# Patient Record
Sex: Female | Born: 1975 | Race: White | Hispanic: No | Marital: Married | Smoking: Never smoker
Health system: Southern US, Community
[De-identification: ages and names within clinical notes are randomized; demographics above are authoritative.]

## PROBLEM LIST (undated history)

## (undated) DIAGNOSIS — D039 Melanoma in situ, unspecified: Secondary | ICD-10-CM

## (undated) DIAGNOSIS — A58 Granuloma inguinale: Secondary | ICD-10-CM

## (undated) DIAGNOSIS — H04811 Granuloma of right lacrimal passage: Secondary | ICD-10-CM

## (undated) HISTORY — DX: Melanoma in situ, unspecified: D03.9

## (undated) HISTORY — PX: MELANOMA EXCISION: SHX5266

## (undated) HISTORY — PX: OTHER SURGICAL HISTORY: SHX169

## (undated) HISTORY — DX: Granuloma inguinale: A58

## (undated) HISTORY — DX: Granuloma of right lacrimal passage: H04.811

---

## 2004-09-19 ENCOUNTER — Inpatient Hospital Stay (HOSPITAL_COMMUNITY): Admission: AD | Admit: 2004-09-19 | Discharge: 2004-09-21 | Payer: Self-pay | Admitting: Obstetrics and Gynecology

## 2004-09-22 ENCOUNTER — Encounter: Admission: RE | Admit: 2004-09-22 | Discharge: 2004-10-22 | Payer: Self-pay | Admitting: Obstetrics and Gynecology

## 2019-07-21 ENCOUNTER — Emergency Department (HOSPITAL_COMMUNITY)
Admission: EM | Admit: 2019-07-21 | Discharge: 2019-07-21 | Disposition: A | Discharge status: Home / Self Care | Payer: Self-pay | Attending: Emergency Medicine | Admitting: Emergency Medicine

## 2019-07-21 ENCOUNTER — Other Ambulatory Visit: Payer: Self-pay

## 2019-07-21 ENCOUNTER — Emergency Department (HOSPITAL_COMMUNITY): Payer: Self-pay

## 2019-07-21 DIAGNOSIS — R079 Chest pain, unspecified: Secondary | ICD-10-CM

## 2019-07-21 DIAGNOSIS — R0600 Dyspnea, unspecified: Secondary | ICD-10-CM | POA: Insufficient documentation

## 2019-07-21 DIAGNOSIS — R0789 Other chest pain: Secondary | ICD-10-CM | POA: Insufficient documentation

## 2019-07-21 LAB — CBC
HCT: 43.4 % (ref 36.0–46.0)
Hemoglobin: 13.9 g/dL (ref 12.0–15.0)
MCH: 30.2 pg (ref 26.0–34.0)
MCHC: 32 g/dL (ref 30.0–36.0)
MCV: 94.1 fL (ref 80.0–100.0)
Platelets: 412 10*3/uL — ABNORMAL HIGH (ref 150–400)
RBC: 4.61 MIL/uL (ref 3.87–5.11)
RDW: 13 % (ref 11.5–15.5)
WBC: 13.6 10*3/uL — ABNORMAL HIGH (ref 4.0–10.5)
nRBC: 0 % (ref 0.0–0.2)

## 2019-07-21 LAB — BASIC METABOLIC PANEL
Anion gap: 10 (ref 5–15)
BUN: 13 mg/dL (ref 6–20)
CO2: 24 mmol/L (ref 22–32)
Calcium: 9.8 mg/dL (ref 8.9–10.3)
Chloride: 106 mmol/L (ref 98–111)
Creatinine, Ser: 0.79 mg/dL (ref 0.44–1.00)
GFR calc Af Amer: >60 mL/min (ref >60)
GFR calc non Af Amer: >60 mL/min (ref >60)
Glucose, Bld: 96 mg/dL (ref 70–99)
Potassium: 3.5 mmol/L (ref 3.5–5.1)
Sodium: 140 mmol/L (ref 135–145)

## 2019-07-21 LAB — I-STAT BETA HCG BLOOD, ED (MC, WL, AP ONLY): I-stat hCG, quantitative: <5 m[IU]/mL (ref <5)

## 2019-07-21 LAB — TROPONIN I (HIGH SENSITIVITY): Troponin I (High Sensitivity): 2 ng/L (ref <18)

## 2019-07-21 NOTE — Discharge Instructions (Addendum)
The testing today in the emergency department is reassuring.  This also helps to bolster the findings from [facility] emergency department about a week ago.  Unfortunately, we do not have a reason for your trouble breathing and chest discomfort.  We are referring you to a pulmonary medical practice for further evaluation and treatment of the trouble breathing.  They should be able to help you with the chest discomfort as well.  Sometimes people need additional evaluation by a cardiologist.  Starting with the lungs however makes the most sense at this time.  In the meantime, make sure you are sleeping well, eating and drinking regularly and taking time to care for your needs.  Return here if needed for problems that can be managed at home.

## 2019-07-21 NOTE — ED Provider Notes (Signed)
[facility] EMERGENCY DEPARTMENT Provider Note   CSN: [id number] Arrival date & time: 07/21/19  1259     History Chief Complaint  Patient presents with  . Chest Pain  . Shortness of Breath    Tiffany Hughes is a 44 y.o. female.  HPI She presents for evaluation of shortness of breath which is primarily dyspnea on exertion but occasionally occurs at rest.  She also has chest discomfort described as a burning sensation and tight feeling in the right side of her neck.  The symptoms have been going on for 3 weeks.  She was evaluated a week ago at an emergency department at [facility], with comprehensive testing including advanced imaging.  Findings were reassuring and not diagnostic for her discomfort at the time.  She was referred to PCP but has not seen a PCP yet.  She has appointment with a new internist, scheduled for next week.  She actually has a internist currently, but has decided not to see that doctor again.  She did see this doctor, 3 weeks ago had laboratory testing that did not reveal anything, and the patient was disappointed that she did not receive a referral or have resolution of the discomfort.  There has been no fever, productive cough, headache, focal weakness or paresthesia.  She states she is sleeping more than usual.  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  She is here with her husband.  No prior similar problems of this nature.  There are no other known modifying factors.    No past medical history on file.  There are no problems to display for this patient.     OB History   No obstetric history on file.     No family history on file.  Social History   Tobacco Use  . Smoking status: Not on file  Substance Use Topics  . Alcohol use: Not on file  . Drug use: Not on file    Home Medications Prior to Admission medications   Not on File    Allergies    Patient has no allergy information on record.  Review of Systems     Review of Systems  All other systems reviewed and are negative.   Physical Exam Updated Vital Signs BP 121/68   Pulse 66   Temp 98 F (36.7 C) (Oral)   Resp 16   Ht 5\' 5"  (1.651 m)   Wt 56.2 kg   SpO2 98%   BMI 20.63 kg/m   Physical Exam Vitals and nursing note reviewed.  Constitutional:      General: She is not in acute distress.    Appearance: She is well-developed. She is diaphoretic. She is not ill-appearing or toxic-appearing.  HENT:     Head: Normocephalic and atraumatic.  Eyes:     Conjunctiva/sclera: Conjunctivae normal.     Pupils: Pupils are equal, round, and reactive to light.  Neck:     Trachea: Phonation normal.  Cardiovascular:     Rate and Rhythm: Normal rate and regular rhythm.  Pulmonary:     Effort: Pulmonary effort is normal.     Breath sounds: Normal breath sounds.  Chest:     Chest wall: No tenderness.  Abdominal:     General: There is no distension.     Palpations: Abdomen is soft.     Tenderness: There is no abdominal tenderness. There is no guarding.  Musculoskeletal:        General: Normal  range of motion.     Cervical back: Normal range of motion and neck supple.  Skin:    General: Skin is warm.  Neurological:     Mental Status: She is alert and oriented to person, place, and time.     Motor: No abnormal muscle tone.  Psychiatric:        Mood and Affect: Mood normal.        Behavior: Behavior normal.        Thought Content: Thought content normal.        Judgment: Judgment normal.     ED Results / Procedures / Treatments   Labs (all labs ordered are listed, but only abnormal results are displayed) Labs Reviewed  CBC - Abnormal; Notable for the following components:      Result Value   WBC 13.6 (*)    Platelets 412 (*)    All other components within normal limits  BASIC METABOLIC PANEL  I-STAT BETA HCG BLOOD, ED ([facility], [facility], [facility] ONLY)  TROPONIN I (HIGH SENSITIVITY)  TROPONIN I (HIGH SENSITIVITY)    EKG None  Radiology DG  Chest 2 View  Result Date: 07/21/2019 CLINICAL DATA:  Chest pain and jaw tightness for 1 day. EXAM: CHEST - 2 VIEW COMPARISON:  None. FINDINGS: Lungs clear. Heart size normal. No pneumothorax or pleural fluid. No acute or focal bony abnormality. IMPRESSION: Negative chest. Electronically Signed   By: Inge Rise M.D.   On: 07/21/2019 13:25    Procedures Procedures (including critical care time)  Medications Ordered in ED Medications - No data to display  ED Course  I have reviewed the triage vital signs and the nursing notes.  Pertinent labs & imaging results that were available during my care of the patient were reviewed by me and considered in my medical decision making (see chart for details).    MDM Rules/Calculators/A&P                       Patient Vitals for the past 24 hrs:  BP Temp Temp src Pulse Resp SpO2 Height Weight  07/21/19 2148 -- 98 F (36.7 C) Oral -- -- -- -- --  07/21/19 2145 121/68 -- -- 66 16 98 % -- --  07/21/19 2116 -- -- -- -- -- 99 % -- --  07/21/19 2115 (!) 146/97 -- -- 86 15 98 % -- --  07/21/19 2100 123/66 -- -- 80 13 99 % -- --  07/21/19 2052 -- -- -- 77 14 98 % -- --  07/21/19 1924 132/74 -- -- 72 18 99 % -- --  07/21/19 1306 -- -- -- -- -- -- 5\' 5"  (1.651 m) 56.2 kg  07/21/19 1304 (!) 157/88 99 F (37.2 C) Oral 82 18 100 % -- --    At discharge- reevaluation with update and discussion. After initial assessment and treatment, an updated evaluation reveals no change in status, findings discussed and questions answered. Daleen Bo   Medical Decision Making:  This patient is presenting for evaluation of shortness of breath, which does require a range of treatment options, and is a complaint that involves a moderate risk of morbidity and mortality. The differential diagnoses include asthma, bronchitis, pneumonia, ACS. I decided to review old records, and in summary patient with ongoing shortness of breath for 3 weeks despite evaluation by  PCP, a local ED, and treatment with usual home medications.  I obtained additional historical information from her husband at the bedside.  Clinical Laboratory Tests Ordered, included CBC and Metabolic panel. Review indicates normal except mild elevation white blood cell count. Radiologic Tests Ordered, included chest x-ray.  I independently Visualized: Radiographic images, which show no CHF or infiltrate  Cardiac Monitor Tracing which shows normal sinus rhythm   I  Critical Interventions-clinical evaluation, laboratory testing, chest x-ray, observation reassessment  After These Interventions, the Patient was reevaluated and was found stable for discharge.  Patient with ongoing shortness of breath and negative work-up multiple times.  Consider stress related testing versus occult pulmonary disorder.  CRITICAL CARE-no Performed by: Mancel Bale  Nursing Notes Reviewed/ Care Coordinated Applicable Imaging Reviewed Interpretation of Laboratory Data incorporated into ED treatment  The patient appears reasonably screened and/or stabilized for discharge and I doubt any other medical condition or other [facility] requiring further screening, evaluation, or treatment in the ED at this time prior to discharge.  Plan: Home Medications-continue usual medications; Home Treatments-gradual advance diet and activity; return here if the recommended treatment, does not improve the symptoms; Recommended follow up-referral to pulmonary for ongoing evaluation and treatment     Final Clinical Impression(s) / ED Diagnoses Final diagnoses:  Dyspnea, unspecified type  Chest pain, unspecified type    Rx / DC Orders ED Discharge Orders    None       Mancel Bale, MD 07/21/19 2339

## 2019-07-21 NOTE — ED Triage Notes (Signed)
Pt endorses upper chest pain, right sided neck pain and SOB for 3 weeks that has worsened. Pt seen at [facility], had an elevated D-dimer but negative CT scan.

## 2019-07-29 ENCOUNTER — Ambulatory Visit: Payer: Self-pay | Admitting: Internal Medicine

## 2019-07-29 ENCOUNTER — Encounter: Payer: Self-pay | Admitting: Internal Medicine

## 2019-07-29 ENCOUNTER — Ambulatory Visit (HOSPITAL_COMMUNITY)
Admission: RE | Admit: 2019-07-29 | Discharge: 2019-07-29 | Disposition: A | Discharge status: Home / Self Care | Payer: Self-pay | Source: Ambulatory Visit | Attending: Internal Medicine | Admitting: Internal Medicine

## 2019-07-29 ENCOUNTER — Other Ambulatory Visit: Payer: Self-pay

## 2019-07-29 VITALS — BP 116/65 | HR 79 | Temp 99.0°F | Ht 65.0 in | Wt 128.5 lb

## 2019-07-29 DIAGNOSIS — R079 Chest pain, unspecified: Secondary | ICD-10-CM | POA: Insufficient documentation

## 2019-07-29 DIAGNOSIS — R0789 Other chest pain: Secondary | ICD-10-CM

## 2019-07-29 NOTE — Assessment & Plan Note (Addendum)
Patient states that she has been having approximately 1 month history of dyspnea, palpitations, chest tightness, headaches, fatigue. Initially seen by her OB/GYN who s ordered electrolyte, thyroid test which were within normal limits.  She was evaluated at [facility] on 5/11 and had a negative ct pe but with presence of lung nodules, normal chest x-ray and troponin.  Patient without any metabolic changes, symptoms seem consistent with anxiety disorder.  He was reevaluated on 07/21/2019 in the ED and not found to have any remarkable work-up.  Assessment and plan Patient's signs and symptoms are [place] for possible atrial fibrillation.  We will go to echo to evaluate for any structural heart disease.  EKG done in office showed normal sinus rhythm without any PVCs, dropped beats, QT prolongation.  Will refer patient to cardiology for further evaluation.  -Quested records from OB/GYN and [facility] urgent care -TTE -Cardiology referral

## 2019-07-29 NOTE — Progress Notes (Signed)
   CC: Chest discomfort  HPI:  Ms.Anastazja A Brokaw is a 44 y.o. with no known medical problems who presents to establish care and discuss regarding chest discomfort. Please see problem based charting for evaluation, assessment, and plan.  Past Medical History:  Diagnosis Date  . Granuloma inguinale, female   . Granuloma of lacrimal passage, right   . Melanoma in situ [facility])      Social History   Socioeconomic History  . Marital status: Single    Spouse name: Not on file  . Number of children: Not on file  . Years of education: Not on file  . Highest education level: Not on file  Occupational History  . 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 Smoking status: Never Smoker  Substance and Sexual Activity  . Alcohol use: Yes    Comment: ocassionally  . Drug use: Never  . Sexual activity: Not on file  Other Topics Concern  . Not on file  Social History Narrative  . Not on file   Social Determinants of Health   Financial Resource Strain:   . Difficulty of Paying Living Expenses:   Food Insecurity:   . Worried About Programme researcher, broadcasting/film/video in the Last Year:   . Barista in the Last Year:   Transportation Needs:   . Freight forwarder (Medical):   Marland Kitchen Lack of Transportation (Non-Medical):   Physical Activity:   . Days of Exercise per Week:   . Minutes of Exercise per Session:   Stress:   . Feeling of Stress :   Social Connections:   . Frequency of Communication with Friends and Family:   . Frequency of Social Gatherings with Friends and Family:   . Attends Religious Services:   . Active Member of Clubs or Organizations:   . Attends Banker Meetings:   Marland Kitchen Marital Status:    Family History  Problem Relation Age of Onset  . Hepatitis Mother        idiopathic obstruction airway disease, venous insufficiency  . Hepatitis C Father   . Sarcoidosis Father   . Stroke Maternal Grandmother   . Melanoma Paternal Aunt   . Deep vein thrombosis Maternal  Uncle     Review of Systems:    Denies nausea, vomiting, fevers, chills  Physical Exam:  Vitals:   07/29/19 1350  BP: 116/65  Pulse: 79  Temp: 99 F (37.2 C)  TempSrc: Oral  SpO2: 98%  Weight: 128 lb 8 oz (58.3 kg)  Height: 5\' 5"  (1.651 m)   Physical Exam  Constitutional: She appears well-developed and well-nourished. No distress.  HENT:  Head: Normocephalic and atraumatic.  Eyes: Conjunctivae are normal.  Cardiovascular: Normal rate, regular rhythm and normal heart sounds.  Respiratory: Effort normal and breath sounds normal. No respiratory distress. She has no wheezes.  GI: Soft. Bowel sounds are normal. She exhibits no distension. There is no abdominal tenderness.  Musculoskeletal:        General: No edema.  Neurological: She is alert.  Skin: She is not diaphoretic.  Psychiatric: She has a normal mood and affect. Her behavior is normal. Judgment and thought content normal.   Assessment & Plan:   See Encounters Tab for problem based charting.  Patient discussed with Dr. 

## 2019-07-29 NOTE — Patient Instructions (Signed)
It was a pleasure to see you today Tiffany Hughes. Please make the following changes:  I am sorry to hear about your new health concerns. We will refer you to cardiology and also send you to get an echocardiogram done. Thank you!  If you have any questions or concerns, please call our clinic at [phone] between 9am-5pm and after hours call [phone] and ask for the internal medicine resident on call. If you feel you are having a medical emergency please call 911.   Thank you, we look forward to help you remain healthy!  Lorenso Courier, MD Internal Medicine PGY3   To schedule an appointment for a COVID vaccine or be added to the vaccine wait list: Go to [email or web address]   OR Go to [email or web address]                  OR Call [phone]                                     OR Call [phone] and select Option 2  [address] [facility] [place]) [place] [address] [facility] [place], [place] [address] Hours: Mon,Thu 8-5, Sat 8-12 Type: Pfizer (12+)  [address]  [facility] [place]) [place] [employer] [address] [place], [place] [address] Hours: Thu: 1-5 Type: Pfizer (12+)

## 2019-07-30 NOTE — Progress Notes (Signed)
Internal Medicine Clinic Attending  Case discussed with Dr. Chundi at the time of the visit.  We reviewed the resident's history and exam and pertinent patient test results.  I agree with the assessment, diagnosis, and plan of care documented in the resident's note. 

## 2019-07-31 ENCOUNTER — Telehealth: Payer: Self-pay | Admitting: Internal Medicine

## 2019-07-31 NOTE — Telephone Encounter (Signed)
Called echo dept and they stated that they will work on it.

## 2019-07-31 NOTE — Telephone Encounter (Signed)
Pt calling back in reference to an Echo Order. The Echo Department [phone]  Sch are unable to sch the original order placed.

## 2019-08-06 ENCOUNTER — Other Ambulatory Visit: Payer: Self-pay

## 2019-08-06 ENCOUNTER — Ambulatory Visit (HOSPITAL_COMMUNITY)
Admission: RE | Admit: 2019-08-06 | Discharge: 2019-08-06 | Disposition: A | Discharge status: Home / Self Care | Payer: Self-pay | Source: Ambulatory Visit | Attending: Internal Medicine | Admitting: Internal Medicine

## 2019-08-06 DIAGNOSIS — R0789 Other chest pain: Secondary | ICD-10-CM | POA: Insufficient documentation

## 2019-08-06 NOTE — Progress Notes (Signed)
  Echocardiogram 2D Echocardiogram has been performed.  Pieter Partridge 08/06/2019, 3:18 PM

## 2019-08-10 ENCOUNTER — Telehealth: Payer: Self-pay | Admitting: Internal Medicine

## 2019-08-10 NOTE — Telephone Encounter (Signed)
Pt calling to get the results of her Echo Test.  Please call patient back.

## 2019-08-12 NOTE — Telephone Encounter (Signed)
Patient was called and informed of results. Should follow with cardiology

## 2019-08-17 ENCOUNTER — Other Ambulatory Visit: Payer: Self-pay

## 2019-08-17 ENCOUNTER — Encounter: Payer: Self-pay | Admitting: Internal Medicine

## 2019-08-17 ENCOUNTER — Ambulatory Visit (INDEPENDENT_AMBULATORY_CARE_PROVIDER_SITE_OTHER): Payer: Self-pay | Admitting: Internal Medicine

## 2019-08-17 VITALS — BP 118/64 | HR 86 | Temp 98.6°F | Ht 65.0 in | Wt 130.4 lb

## 2019-08-17 DIAGNOSIS — R519 Headache, unspecified: Secondary | ICD-10-CM

## 2019-08-17 DIAGNOSIS — R5383 Other fatigue: Secondary | ICD-10-CM

## 2019-08-17 NOTE — Patient Instructions (Signed)
Get blood work Try out the inhaler and see if it helps your symptoms Let me know if they find anything on your heart and thyroid workup Keep symptom diary We will touch base over phone in 1 month

## 2019-08-17 NOTE — Progress Notes (Signed)
Synopsis: Dyspnea, chest pain  Assessment & Plan:  Problem 1 DOE, palpitations:  That it awakens her from sleep makes me more concerned for an arrhythmia.  She sees cardiology Friday.  I reviewed her thyroid studies from allergy clinic: WNL.  Her CT chest is benign.  CBC and BMP unremarkable except mild leukocytosis.  Resting EKG and echo reassuring.  FeNO at allergist indeterminate.  She has no wheezing on exam.  She walked around the office at least 100 yards at a brisk pace without issues on pulse ox.  With the headaches could check for something like GPA and with systemic symptoms will screen for lupus.  Reasonable to try treating an asthma variant as well. - ESR, CRP, ANA, CBC/diff - Can try the ICS sample given by allergy clinic to see if helps at all - Will see what cardiology thinks Friday - She will keep symptom diary - Tele visit in 1 month to review tests and symptom diary to see if we can find a pattern  MDM . I reviewed prior external note(s) from Dr. Eulis Foster on 07/21/19 . I reviewed the result(s) of CBC and BMP on 07/21/19 as well as EKG and echo . I have ordered the tests above  Review of patient's CTA Chest images revealed a couple sub-57m nodules that do not need followup, there is no evidence of PE or intrinsic lung disease. The patient's images have been independently reviewed by me.     End of visit medications:  Current Outpatient Medications:  .  calcium acetate, Phos Binder, (PHOSLYRA) 667 MG/5ML SOLN, Take by mouth 3 (three) times daily with meals., Disp: , Rfl:  .  ergocalciferol (VITAMIN D2) 1.25 MG (50000 UT) capsule, Take 50,000 Units by mouth once a week., Disp: , Rfl:  .  Flaxseed, Linseed, (FLAXSEED OIL) 1000 MG CAPS, Take by mouth., Disp: , Rfl:  .  Fluticasone Furoate (ARNUITY ELLIPTA) 100 MCG/ACT AEPB, Inhale into the lungs., Disp: , Rfl:  .  magnesium (MAGTAB) 84 MG (7MEQ) TBCR SR tablet, Take 84 mg by mouth., Disp: , Rfl:  .  Menatetrenone (VITAMIN K2)  100 MCG TABS, Take by mouth., Disp: , Rfl:  .  Omega-3 Fatty Acids (FISH OIL) 1000 MG CAPS, Take by mouth., Disp: , Rfl:  .  Prenatal Vit-Fe Fumarate-FA (PRENATAL MULTIVITAMIN) TABS tablet, Take 1 tablet by mouth daily at 12 noon., Disp: , Rfl:  .  terbinafine (LAMISIL) 250 MG tablet, Take 250 mg by mouth daily., Disp: , Rfl:    DCandee Furbish MD [facility] 08/17/2019 5:16 PM    Subjective:   PATIENT ID: AJunita PushGENDER: female DOB: [birth year] MRN: [id number] Chief Complaint  Patient presents with  . Consult    SOB, chest tightness and whezzing  for 8 weeks     HPI 44year old woman presenting with 7-8 weeks of intermittent chest tightness associated with palpitations. Never smoker, no history of lung disease. Intermittent wheezing associated but not consistently. No obvious travel or environmental exposures to correlate. No preceding illness. Does have fatigue since this has started. Has awakened her from sleep. No issues with breathing in past. Mother had an undiagnosed obstructive lung disorder that she refused workup for. Father had sarcoidosis. Workup to date (echo, ekg, cbc, bmp, CTA chest) has not revealed a cause. She saw allergist who gave her ICS to try, she has not tried yet.  Ancillary information including prior medications, full medical/surgical/family/social histoies, and PFTs (when  available) are listed below and have been reviewed.   ROS + symptoms in bold Fevers, chills, weight loss Nausea, vomiting, diarrhea Shortness of breath, wheezing, cough Chest pain, palpitations, lower ext edema   Objective:   Vitals:   08/17/19 1556  BP: 118/64  Pulse: 86  Temp: 98.6 F (37 C)  TempSrc: Oral  SpO2: 99%  Weight: 130 lb 6.4 oz (59.1 kg)  Height: 5' 5"  (1.651 m)   99% on RA BMI Readings from Last 3 Encounters:  08/17/19 21.70 kg/m  07/29/19 21.38 kg/m  07/21/19 20.63 kg/m   Wt Readings from Last 3 Encounters:   08/17/19 130 lb 6.4 oz (59.1 kg)  07/29/19 128 lb 8 oz (58.3 kg)  07/21/19 124 lb (56.2 kg)    GEN: thin well appearing woman in no acute distress HEENT: trachea midline, mucus membranes moist CV: Regular rate and rhythm, extremities are warm PULM: Clear, no wheezing or accessory muscle use GI: Soft, +BS EXT: no edema NEURO: Moves all 4 extremities, ambulates well   Ancillary Information    Past Medical History:  Diagnosis Date  . Granuloma inguinale, female   . Granuloma of lacrimal passage, right   . Melanoma in situ [facility])      Family History  Problem Relation Age of Onset  . Hepatitis Mother        idiopathic obstruction airway disease, venous insufficiency  . Hepatitis C Father   . Sarcoidosis Father   . Stroke Maternal Grandmother   . Melanoma Paternal Aunt   . Deep vein thrombosis Maternal Uncle      Social History   Socioeconomic History  . Marital status: Single    Spouse name: Not on file  . Number of children: Not on file  . Years of education: Not on file  . Highest education level: Not on file  Occupational History  . 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 Smoking status: Never Smoker  . Smokeless tobacco: Never Used  Substance and Sexual Activity  . Alcohol use: Yes    Comment: ocassionally  . Drug use: Never  . Sexual activity: Not on file  Other Topics Concern  . Not on file  Social History Narrative  . Not on file   Social Determinants of Health   Financial Resource Strain:   . Difficulty of Paying Living Expenses:   Food Insecurity:   . Worried About Charity fundraiser in the Last Year:   . Arboriculturist in the Last Year:   Transportation Needs:   . Film/video editor (Medical):   Marland Kitchen Lack of Transportation (Non-Medical):   Physical Activity:   . Days of Exercise per Week:   . Minutes of Exercise per Session:   Stress:   . Feeling of Stress :   Social Connections:   . Frequency of Communication with Friends and  Family:   . Frequency of Social Gatherings with Friends and Family:   . Attends Religious Services:   . Active Member of Clubs or Organizations:   . Attends Archivist Meetings:   Marland Kitchen Marital Status:   Intimate Partner Violence:   . Fear of Current or Ex-Partner:   . Emotionally Abused:   Marland Kitchen Physically Abused:   . Sexually Abused:      Allergies  Allergen Reactions  . Polysporin [Bacitracin-Polymyxin B] Rash     CBC    Component Value Date/Time   WBC 13.6 (H) 07/21/2019 1314   RBC 4.61  07/21/2019 1314   HGB 13.9 07/21/2019 1314   HCT 43.4 07/21/2019 1314   PLT 412 (H) 07/21/2019 1314   MCV 94.1 07/21/2019 1314   MCH 30.2 07/21/2019 1314   MCHC 32.0 07/21/2019 1314   RDW 13.0 07/21/2019 1314    Pulmonary Functions Testing Results: No flowsheet data found.  Outpatient Medications Prior to Visit  Medication Sig Dispense Refill  . calcium acetate, Phos Binder, (PHOSLYRA) 667 MG/5ML SOLN Take by mouth 3 (three) times daily with meals.    . ergocalciferol (VITAMIN D2) 1.25 MG (50000 UT) capsule Take 50,000 Units by mouth once a week.    . Flaxseed, Linseed, (FLAXSEED OIL) 1000 MG CAPS Take by mouth.    . Fluticasone Furoate (ARNUITY ELLIPTA) 100 MCG/ACT AEPB Inhale into the lungs.    . magnesium (MAGTAB) 84 MG (7MEQ) TBCR SR tablet Take 84 mg by mouth.    . Menatetrenone (VITAMIN K2) 100 MCG TABS Take by mouth.    . Omega-3 Fatty Acids (FISH OIL) 1000 MG CAPS Take by mouth.    . Prenatal Vit-Fe Fumarate-FA (PRENATAL MULTIVITAMIN) TABS tablet Take 1 tablet by mouth daily at 12 noon.    . terbinafine (LAMISIL) 250 MG tablet Take 250 mg by mouth daily.     No facility-administered medications prior to visit.

## 2019-08-18 LAB — CBC WITH DIFFERENTIAL/PLATELET
Basophils Absolute: 0.1 10*3/uL (ref 0.0–0.1)
Basophils Relative: 0.6 % (ref 0.0–3.0)
Eosinophils Absolute: 0.1 10*3/uL (ref 0.0–0.7)
Eosinophils Relative: 1 % (ref 0.0–5.0)
HCT: 38.8 % (ref 36.0–46.0)
Hemoglobin: 13 g/dL (ref 12.0–15.0)
Lymphocytes Relative: 23.8 % (ref 12.0–46.0)
Lymphs Abs: 2.6 10*3/uL (ref 0.7–4.0)
MCHC: 33.6 g/dL (ref 30.0–36.0)
MCV: 92.2 fl (ref 78.0–100.0)
Monocytes Absolute: 0.4 10*3/uL (ref 0.1–1.0)
Monocytes Relative: 4 % (ref 3.0–12.0)
Neutro Abs: 7.6 10*3/uL (ref 1.4–7.7)
Neutrophils Relative %: 70.6 % (ref 43.0–77.0)
Platelets: 362 10*3/uL (ref 150.0–400.0)
RBC: 4.21 Mil/uL (ref 3.87–5.11)
RDW: 13.2 % (ref 11.5–15.5)
WBC: 10.8 10*3/uL — ABNORMAL HIGH (ref 4.0–10.5)

## 2019-08-18 LAB — SEDIMENTATION RATE: Sed Rate: 5 mm/hr (ref 0–20)

## 2019-08-18 LAB — C-REACTIVE PROTEIN: CRP: <1 mg/dL (ref 0.5–20.0)

## 2019-08-19 LAB — ANA,IFA RA DIAG PNL W/RFLX TIT/PATN
Anti Nuclear Antibody (ANA): POSITIVE — AB
Cyclic Citrullin Peptide Ab: <16 UNITS
Rheumatoid fact SerPl-aCnc: <14 IU/mL (ref <14)

## 2019-08-19 LAB — IGE: IgE (Immunoglobulin E), Serum: 73 kU/L (ref <=114)

## 2019-08-19 LAB — ANTI-NUCLEAR AB-TITER (ANA TITER): ANA Titer 1: 1:320 {titer} — ABNORMAL HIGH

## 2019-08-21 ENCOUNTER — Other Ambulatory Visit: Payer: Self-pay

## 2019-08-21 ENCOUNTER — Telehealth: Payer: Self-pay | Admitting: Radiology

## 2019-08-21 ENCOUNTER — Encounter: Payer: Self-pay | Admitting: Radiology

## 2019-08-21 ENCOUNTER — Telehealth: Payer: Self-pay | Admitting: Cardiovascular Disease

## 2019-08-21 ENCOUNTER — Ambulatory Visit (INDEPENDENT_AMBULATORY_CARE_PROVIDER_SITE_OTHER): Payer: Self-pay | Admitting: Cardiovascular Disease

## 2019-08-21 ENCOUNTER — Encounter: Payer: Self-pay | Admitting: Cardiovascular Disease

## 2019-08-21 VITALS — BP 140/76 | HR 68 | Ht 65.0 in | Wt 129.2 lb

## 2019-08-21 DIAGNOSIS — R0609 Other forms of dyspnea: Secondary | ICD-10-CM | POA: Insufficient documentation

## 2019-08-21 DIAGNOSIS — R072 Precordial pain: Secondary | ICD-10-CM

## 2019-08-21 DIAGNOSIS — R0789 Other chest pain: Secondary | ICD-10-CM

## 2019-08-21 DIAGNOSIS — E785 Hyperlipidemia, unspecified: Secondary | ICD-10-CM | POA: Insufficient documentation

## 2019-08-21 DIAGNOSIS — Z01812 Encounter for preprocedural laboratory examination: Secondary | ICD-10-CM

## 2019-08-21 DIAGNOSIS — E782 Mixed hyperlipidemia: Secondary | ICD-10-CM

## 2019-08-21 DIAGNOSIS — R0600 Dyspnea, unspecified: Secondary | ICD-10-CM

## 2019-08-21 DIAGNOSIS — R002 Palpitations: Secondary | ICD-10-CM | POA: Insufficient documentation

## 2019-08-21 LAB — BASIC METABOLIC PANEL
BUN/Creatinine Ratio: 19 (ref 9–23)
BUN: 11 mg/dL (ref 6–24)
CO2: 23 mmol/L (ref 20–29)
Calcium: 9.7 mg/dL (ref 8.7–10.2)
Chloride: 103 mmol/L (ref 96–106)
Creatinine, Ser: 0.58 mg/dL (ref 0.57–1.00)
GFR calc Af Amer: 130 mL/min/{1.73_m2} (ref >59)
GFR calc non Af Amer: 112 mL/min/{1.73_m2} (ref >59)
Glucose: 84 mg/dL (ref 65–99)
Potassium: 4.3 mmol/L (ref 3.5–5.2)
Sodium: 139 mmol/L (ref 134–144)

## 2019-08-21 MED ORDER — METOPROLOL TARTRATE 50 MG PO TABS
ORAL_TABLET | ORAL | 0 refills | Status: DC
Start: 2019-08-21 — End: 2019-10-01

## 2019-08-21 NOTE — Telephone Encounter (Deleted)
error 

## 2019-08-21 NOTE — Telephone Encounter (Signed)
Enrolled patient for a 14 day Zio monitor to be mailed to patients home.  

## 2019-08-21 NOTE — Assessment & Plan Note (Signed)
New onset palpitations since late April after her change a vaccine.  I am to get a 2-week Zio patch to further evaluate

## 2019-08-21 NOTE — Telephone Encounter (Signed)
     Went to chart to check who called pt, transferred call to Carney Bern to schedule CT

## 2019-08-21 NOTE — Assessment & Plan Note (Signed)
History of dyspnea on exertion since late April, several weeks after her J&J vaccine.  She has never smoked.  She exercises frequently prior to all this without symptoms.  Now she is not exercising.  Her 2D echo was essentially normal.

## 2019-08-21 NOTE — Telephone Encounter (Signed)
error 

## 2019-08-21 NOTE — Assessment & Plan Note (Signed)
Tiffany Hughes was referred by the emergency room, Dr. Effie Shy, for evaluation of atypical chest pain.  She received the Ganji vaccine on April 7 of this year and began having chest pain on April 26.  The pain occurs daily.  It radiates to her left neck and jaw.  She was seen in the emergency room at [facility] on 07/14/2019.  D-dimer was mildly elevated.  A chest CT showed no evidence of pulmonary embolism and importantly no coronary calcification.  She can continues to have chest pain was evaluated in the [facility] emergency room by Dr. Effie Shy 07/21/2019 for negative work-up as well.  She did have a 2D echo performed 07/29/2019 which is essentially normal.  I am to get a coronary CTA to further evaluate.

## 2019-08-21 NOTE — Progress Notes (Signed)
08/21/2019 AJANI SCHNIEDERS   Mar 01, 1976  [id number]  Primary Physician Anne Shutter, MD Primary Cardiologist: Runell Gess MD Nicholes Calamity, [place]  HPI:  Tiffany Hughes is a 44 y.o. thin and fit appearing married Caucasian female (originally from [place]) mother of 2 children who is referred by Dr. Effie Shy from the emergency room after he recently saw her for chest pain 07/21/2019.  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  She has no cardiac risk factors.  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  She exercises frequently without symptoms prior to April.  She recently received the Covid change a vaccine on 06/10/2019 and developed chest pain approximately 3 weeks later on 426/21.  She pain occurred daily.  Is unrelated to activity.  It radiated to her right neck and jaw.  She was seen in the [facility] ER on 5/11 and was noted to have a mildly elevated D-dimer.  Chest CT showed no PE and no coronary calcification.  She was seen again in the emergency room by Dr. Effie Shy on 07/21/2019 with chest pain.  Her work-up was unrevealing.  She continues to have symptoms of dyspnea, palpitations and atypical chest pain.  A 2D echocardiogram performed 08/06/2019 was entirely normal.   Current Meds  Medication Sig  . CALCIUM PO Take 1,200 mg by mouth. Patient takes Critical calcium citrate  . Cholecalciferol (VITAMIN D3) 125 MCG (5000 UT) CAPS Take by mouth.  . Flaxseed, Linseed, (FLAXSEED OIL PO) Take 3,000 mg by mouth.   . Fluticasone Furoate (ARNUITY ELLIPTA) 100 MCG/ACT AEPB Inhale into the lungs.  . magnesium (MAGTAB) 84 MG ( ) TBCR SR tablet Take 84 mg by mouth.  . Menatetrenone (VITAMIN K2) 100 MCG TABS Take by mouth.  . Omega-3 Fatty Acids (FISH OIL PO) Take 3,000 mg by mouth.   . Prenatal Vit-Fe Fumarate-FA (PRENATAL MULTIVITAMIN) TABS tablet Take 1 tablet by mouth daily at 12 noon.  . terbinafine  (LAMISIL) 1 % cream Apply 1 application topically 2 (two) times daily. Patient states that it is a liquid that she drips on her toenail     Allergies  Allergen Reactions  . Polysporin [Bacitracin-Polymyxin B] Rash    Social History   Socioeconomic History  . Marital status: Married    Spouse name: Not on file  . Number of children: Not on file  . Years of education: Not on file  . Highest education level: Not on file  Occupational History  . 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 Smoking status: Never Smoker  . Smokeless tobacco: Never Used  Substance and Sexual Activity  . Alcohol use: Yes    Comment: ocassionally  . Drug use: Never  . Sexual activity: Not on file  Other Topics Concern  . Not on file  Social History Narrative  . Not on file   Social Determinants of Health   Financial Resource Strain:   . Difficulty of Paying Living Expenses:   Food Insecurity:   . Worried About Programme researcher, broadcasting/film/video in the Last Year:   . Barista in the Last Year:   Transportation Needs:   . Freight forwarder (Medical):   Marland Kitchen Lack of Transportation (Non-Medical):   Physical Activity:   . Days of Exercise per Week:   . Minutes of Exercise per Session:   Stress:   . Feeling of Stress :  Social Connections:   . Frequency of Communication with Friends and Family:   . Frequency of Social Gatherings with Friends and Family:   . Attends Religious Services:   . Active Member of Clubs or Organizations:   . Attends Archivist Meetings:   Marland Kitchen Marital Status:   Intimate Partner Violence:   . Fear of Current or Ex-Partner:   . Emotionally Abused:   Marland Kitchen Physically Abused:   . Sexually Abused:      Review of Systems: General: negative for chills, fever, night sweats or weight changes.  Cardiovascular: negative for chest pain, dyspnea on exertion, edema, orthopnea, palpitations, paroxysmal nocturnal dyspnea or shortness of breath Dermatological: negative for  rash Respiratory: negative for cough or wheezing Urologic: negative for hematuria Abdominal: negative for nausea, vomiting, diarrhea, bright red blood per rectum, melena, or hematemesis Neurologic: negative for visual changes, syncope, or dizziness All other systems reviewed and are otherwise negative except as noted above.    Blood pressure 140/76, pulse 68, height 5\' 5"  (1.651 m), weight 129 lb 3.2 oz (58.6 kg), last menstrual period 07/24/2019, SpO2 99 %.  General appearance: alert and no distress Neck: no adenopathy, no carotid bruit, no JVD, supple, symmetrical, trachea midline and thyroid not enlarged, symmetric, no tenderness/mass/nodules Lungs: clear to auscultation bilaterally Heart: regular rate and rhythm, S1, S2 normal, no murmur, click, rub or gallop Extremities: extremities normal, atraumatic, no cyanosis or edema Pulses: 2+ and symmetric Skin: Skin color, texture, turgor normal. No rashes or lesions Neurologic: Alert and oriented X 3, normal strength and tone. Normal symmetric reflexes. Normal coordination and gait  EKG sinus rhythm at 68 without ST or T wave changes.  I personally reviewed this EKG.  ASSESSMENT AND PLAN:   Chest discomfort Ms. Quinton was referred by the emergency room, Dr. Eulis Foster, for evaluation of atypical chest pain.  She received the Ganji vaccine on April 7 of this year and began having chest pain on April 26.  The pain occurs daily.  It radiates to her left neck and jaw.  She was seen in the emergency room at [facility] on 07/14/2019.  D-dimer was mildly elevated.  A chest CT showed no evidence of pulmonary embolism and importantly no coronary calcification.  She can continues to have chest pain was evaluated in the [facility] emergency room by Dr. Eulis Foster 07/21/2019 for negative work-up as well.  She did have a 2D echo performed 07/29/2019 which is essentially normal.  I am to get a coronary CTA to further evaluate.  Dyspnea on  exertion History of dyspnea on exertion since late April, several weeks after her J&J vaccine.  She has never smoked.  She exercises frequently prior to all this without symptoms.  Now she is not exercising.  Her 2D echo was essentially normal.  Palpitations New onset palpitations since late April after her change a vaccine.  I am to get a 2-week Zio patch to further evaluate  Hyperlipidemia Her most recent lipid profile performed 12/04/2018 revealed cholesterol 208, LDL 127 and HDL of 68, she eats a fairly "healthy" diet.  She is not at goal for primary prevention.  We will further address this in the future.      Lorretta Harp MD FACP,FACC,FAHA, [facility] 08/21/2019 8:48 AM

## 2019-08-21 NOTE — Patient Instructions (Addendum)
Medication Instructions:  Your Physician recommend you continue on your current medication as directed.    *If you need a refill on your cardiac medications before your next appointment, please call your pharmacy*   Lab Work: Your physician recommends that you return for lab work today ( BMP)    Testing/Procedures: Our physician has recommended that you wear an [employer] monitor. The Zio patch cardiac monitor continuously records heart rhythm data for up to 14 days, this is for patients being evaluated for multiple types heart rhythms. For the first 24 hours post application, please avoid getting the Zio monitor wet in the shower or by excessive sweating during exercise. After that, feel free to carry on with regular activities. Keep soaps and lotions away from the ZIO XT Patch.   Someone will call to mail monitor  CT Angiography (CTA), is a special type of CT scan that uses a computer to produce multi-dimensional views of major blood vessels throughout the body. In CT angiography, a contrast material is injected through an IV to help visualize the blood vessels  [facility]     Follow-Up: At [facility], you and your health needs are our priority.  As part of our continuing mission to provide you with exceptional heart care, we have created designated Provider Care Teams.  These Care Teams include your primary Cardiologist (physician) and Advanced Practice Providers (APPs -  Physician Assistants and Nurse Practitioners) who all work together to provide you with the care you need, when you need it.  We recommend signing up for the patient portal called "MyChart".  Sign up information is provided on this After Visit Summary.  MyChart is used to connect with patients for Virtual Visits (Telemedicine).  Patients are able to view lab/test results, encounter notes, upcoming appointments, etc.  Non-urgent messages can be sent to your provider as well.   To learn more about what  you can do with MyChart, go to [email or web address].    Your next appointment:   3 months  The format for your next appointment:   In Person  Provider:   Quay Burow, MD  [place] Monitor Instructions   Your physician has requested you wear your ZIO patch monitor__14_____days.   This is a single patch monitor.  [employer] supplies one patch monitor per enrollment.  Additional stickers are not available.   Please do not apply patch if you will be having a Nuclear Stress Test, Echocardiogram, Cardiac CT, MRI, or Chest Xray during the time frame you would be wearing the monitor. The patch cannot be worn during these tests.  You cannot remove and re-apply the ZIO XT patch monitor.   Your ZIO patch monitor will be sent USPS Priority mail from [facility] directly to your home address. The monitor may also be mailed to a PO BOX if home delivery is not available.   It may take 3-5 days to receive your monitor after you have been enrolled.   Once you have received you monitor, please review enclosed instructions.  Your monitor has already been registered assigning a specific monitor serial # to you.   Applying the monitor   Shave hair from upper left chest.   Hold abrader disc by orange tab.  Rub abrader in 40 strokes over left upper chest as indicated in your monitor instructions.   Clean area with 4 enclosed alcohol pads .  Use all pads to assure are is cleaned thoroughly.  Let dry.  Apply patch as indicated in monitor instructions.  Patch will be place under collarbone on left side of chest with arrow pointing upward.   Rub patch adhesive wings for 2 minutes.Remove white label marked "1".  Remove white label marked "2".  Rub patch adhesive wings for 2 additional minutes.   While looking in a mirror, press and release button in center of patch.  A small green light will flash 3-4 times .  This will be your only indicator the monitor has been turned on.     Do not  shower for the first 24 hours.  You may shower after the first 24 hours.   Press button if you feel a symptom. You will hear a small click.  Record Date, Time and Symptom in the Patient Log Book.   When you are ready to remove patch, follow instructions on last 2 pages of Patient Log Book.  Stick patch monitor onto last page of Patient Log Book.   Place Patient Log Book in [place] box.  Use locking tab on box and tape box closed securely.  The [employer] and [employer] has [employer] on it.  Please place in mailbox as soon as possible.  Your physician should have your test results approximately 7 days after the monitor has been mailed back to [facility].   Call [place] at [phone] if you have questions regarding your ZIO XT patch monitor.  Call them immediately if you see an orange light blinking on your monitor.   If your monitor falls off in less than 4 days contact our Monitor department at [phone].  If your monitor becomes loose or falls off after 4 days call [employer] at [phone] for suggestions on securing your monitor.   Your cardiac CT will be scheduled at one of the below locations:   [facility] [address] [place], [place] [address] [phone]   If scheduled at [facility], please arrive at the [facility] main entrance of [facility] 30 minutes prior to test start time. Proceed to the [facility] Radiology Department (first floor) to check-in and test prep.  If scheduled at [facility], please arrive 15 mins early for check-in and test prep.  Please follow these instructions carefully (unless otherwise directed):   On the Night Before the Test: . Be sure to Drink plenty of water. . Do not consume any caffeinated/decaffeinated beverages or chocolate 12 hours prior to your test. . Do not take any antihistamines 12 hours prior to your test.  On the Day of the Test: . Drink plenty  of water. Do not drink any water within one hour of the test. . Do not eat any food 4 hours prior to the test. . You may take your regular medications prior to the test.  . Take metoprolol (Lopressor) 50 mg two hours prior to test. . FEMALES- please wear underwire-free bra if available        After the Test: . Drink plenty of water. . After receiving IV contrast, you may experience a mild flushed feeling. This is normal. . On occasion, you may experience a mild rash up to 24 hours after the test. This is not dangerous. If this occurs, you can take Benadryl 25 mg and increase your fluid intake. . If you experience trouble breathing, this can be serious. If it is severe call 911 IMMEDIATELY. If it is mild, please call our office. . If you take any  of these medications: Glipizide/Metformin, Avandament, Glucavance, please do not take 48 hours after completing test unless otherwise instructed.   Once we have confirmed authorization from your insurance company, we will call you to set up a date and time for your test.   For non-scheduling related questions, please contact the cardiac imaging nurse navigator should you have any questions/concerns: Marchia Bond, Cardiac Imaging Nurse Navigator Burley Saver, Interim Cardiac Imaging Nurse [place] [facility] Direct Office Dial: [phone]   For scheduling needs, including cancellations and rescheduling, please call [phone].

## 2019-08-21 NOTE — Assessment & Plan Note (Signed)
Her most recent lipid profile performed 12/04/2018 revealed cholesterol 208, LDL 127 and HDL of 68, she eats a fairly "healthy" diet.  She is not at goal for primary prevention.  We will further address this in the future.

## 2019-08-25 ENCOUNTER — Telehealth: Payer: Self-pay | Admitting: Cardiovascular Disease

## 2019-08-25 ENCOUNTER — Telehealth (HOSPITAL_COMMUNITY): Payer: Self-pay | Admitting: *Deleted

## 2019-08-25 DIAGNOSIS — R072 Precordial pain: Secondary | ICD-10-CM

## 2019-08-25 NOTE — Telephone Encounter (Signed)
Attempted to call patient regarding upcoming cardiac CT appointment. °Left message on voicemail with name and callback number ° °Lashawnna Lambrecht Tai RN Navigator Cardiac Imaging °[facility] °[phone] Office °[phone] Cell ° °

## 2019-08-25 NOTE — Telephone Encounter (Signed)
New Message:    Pt said she received her Monitor today. She is schedule for a CT tomorrow, she wants to know when should she put the Monitor on?

## 2019-08-26 ENCOUNTER — Other Ambulatory Visit: Payer: Self-pay

## 2019-08-26 ENCOUNTER — Ambulatory Visit (HOSPITAL_COMMUNITY)
Admission: RE | Admit: 2019-08-26 | Discharge: 2019-08-26 | Disposition: A | Discharge status: Home / Self Care | Payer: Self-pay | Source: Ambulatory Visit | Attending: Cardiovascular Disease | Admitting: Cardiovascular Disease

## 2019-08-26 ENCOUNTER — Ambulatory Visit: Payer: Self-pay | Admitting: Internal Medicine

## 2019-08-26 VITALS — BP 139/62 | HR 63 | Temp 98.9°F | Ht 65.0 in | Wt 130.9 lb

## 2019-08-26 DIAGNOSIS — R0789 Other chest pain: Secondary | ICD-10-CM

## 2019-08-26 DIAGNOSIS — R768 Other specified abnormal immunological findings in serum: Secondary | ICD-10-CM

## 2019-08-26 DIAGNOSIS — R072 Precordial pain: Secondary | ICD-10-CM | POA: Insufficient documentation

## 2019-08-26 MED ORDER — NITROGLYCERIN 0.4 MG SL SUBL
SUBLINGUAL_TABLET | SUBLINGUAL | Status: AC
  Administered 2019-08-26: 0.8 mg via SUBLINGUAL
  Filled 2019-08-26: qty 2

## 2019-08-26 MED ORDER — IOHEXOL 350 MG/ML SOLN
80.0000 mL | Freq: Once | INTRAVENOUS | Status: AC | PRN
  Administered 2019-08-26: 80 mL via INTRAVENOUS

## 2019-08-26 MED ORDER — NITROGLYCERIN 0.4 MG SL SUBL: 0.8000 mg | SUBLINGUAL_TABLET | Freq: Once | SUBLINGUAL | Status: AC

## 2019-08-26 NOTE — Patient Instructions (Addendum)
Ms. SHALAYNE LEACH,  It was a pleasure to see you today. Thank you for coming in.   Today we discussed your chest pains and feeling unwell. Please continue to follow up with cardiology to evaluate this. Your ANA lab was elevated, given your multiple symptoms there is a concern about an autoimmune process going on. I have referred you to rheumatology and have ordered some labs to further evaluate.   Please return to clinic in 1 month or sooner if needed.   Thank you again for coming in.   Claudean Severance.D.

## 2019-08-26 NOTE — Telephone Encounter (Signed)
Patient will be applying ZIO XT patch monitor 08/27/2019, 24 hours after taking Metoprolol for CT.

## 2019-08-26 NOTE — Progress Notes (Signed)
CC: Chest pain, fatigue, weakness, and generally feeling unwell  HPI:  [name] is a 44 y.o. with the history listed below presenting for follow up of her chest pain, fatigue, weakness, and feeling generally unwell.   Patient reports that for the past 8 weeks she has been having episodes of chest pain, shortness of breath, headaches, palpitations, and generalized fatigue.  She reports that her symptoms seem to be worse when she is standing up and walking around, and feels better when lying down.  She also has a heart rate monitor and reports that her heart rate will go up when she is walking.  She has been drinking salt water, about 1 gallon per day that contains a teaspoon, feels like that helps her symptoms when she does this.  She also had a rash on her right abdomen and left shoulder in May, it was red, raised, nontender, and has now gone away. She does endorse some facial flushing, does not think that it is worse when she is go outside. She denies any joint pains, nausea, vomiting, fevers, chills.   Past Medical History:  Diagnosis Date  . Granuloma inguinale, female   . Granuloma of lacrimal passage, right   . Melanoma in situ (HCC)    Review of Systems:   Constitutional: Negative for chills and fever. Positive for fatigue and weakness. Positive for flushing. Respiratory: Positive for shortness of breath.   Cardiovascular: Positive for chest pain. Negative for leg swelling.  Gastrointestinal: Negative for abdominal pain, nausea and vomiting.  MSK: Negative for joint pain.  Neurological: Negative for dizziness and headaches.   Physical Exam:  Vitals:   08/26/19 1342  BP: 139/62  Pulse: 63  Temp: 98.9 F (37.2 C)  TempSrc: Oral  SpO2: 99%  Weight: 130 lb 14.4 oz (59.4 kg)  Height: 5\' 5"  (1.651 m)   Physical Exam Constitutional:      Appearance: Normal appearance.  HENT:     Head: Normocephalic and atraumatic.     Nose: Nose normal.     Mouth/Throat:      Mouth: Mucous membranes are moist.     Pharynx: Oropharynx is clear.  Eyes:     Extraocular Movements: Extraocular movements intact.     Pupils: Pupils are equal, round, and reactive to light.  Cardiovascular:     Rate and Rhythm: Normal rate and regular rhythm.     Pulses: Normal pulses.     Heart sounds: Normal heart sounds.  Pulmonary:     Effort: Pulmonary effort is normal. No respiratory distress.     Breath sounds: Normal breath sounds. No stridor.  Abdominal:     General: Abdomen is flat. Bowel sounds are normal.     Palpations: Abdomen is soft.  Musculoskeletal:        General: No swelling, tenderness or deformity. Normal range of motion.     Cervical back: Normal range of motion and neck supple.  Skin:    General: Skin is warm and dry.     Capillary Refill: Capillary refill takes less than 2 seconds.  Neurological:     General: No focal deficit present.     Mental Status: She is alert and oriented to person, place, and time.  Psychiatric:        Mood and Affect: Mood normal.        Behavior: Behavior normal.    Assessment & Plan:   See Encounters Tab for problem based charting.  Patient discussed with Dr.  Guilloud

## 2019-08-27 ENCOUNTER — Ambulatory Visit (INDEPENDENT_AMBULATORY_CARE_PROVIDER_SITE_OTHER): Payer: Self-pay

## 2019-08-27 DIAGNOSIS — R002 Palpitations: Secondary | ICD-10-CM

## 2019-08-27 NOTE — Assessment & Plan Note (Signed)
Patient reports that for the past 8 weeks she has been having episodes of chest pain, shortness of breath, headaches, palpitations, and generalized fatigue.  She reports that her symptoms seem to be worse when she is standing up and walking around, and feels better when lying down.  She also has a heart rate monitor and reports that her heart rate will go up when she is walking.  She has been drinking salt water, about 1 gallon per day that contains a teaspoon, feels like that helps her symptoms when she does this.  She also had a rash on her right abdomen and left shoulder in May, it was red, raised, nontender, and has now gone away. She does endorse some facial flushing, does not think that it is worse when she is go outside. She denies any joint pains, nausea, vomiting, fevers, chills. TSH was WNL. Echocardiogram was done that showed normal LV function, trace MR and TR.  She has seen cardiology on 08/21/19, they obtained a coronary CTA to further evaluate and placed a 2 week. She also saw pulmonology on 08/17/19.  Given her multiple systemic symptoms they evaluated her for GPA, lupus and tried treating for asthma variant. Checked a ESR, CRP, ANA, and CBC/diff.   ANA came back positive, titer of 1:320. Given the systemic findings, facial flushing, history of rash, chest pain, and SOB will refer to rheumatology for further evaluation. Concerning for an autoimmune disease such as lupus.   -Checking ds-DNA, SCL, anti-ro, and anti-la to futher evaluate -Rheumatology referral -Follow up with pulmonology and cardiology

## 2019-08-28 LAB — FANA STAINING PATTERNS: Homogeneous Pattern: 1:320 {titer} — ABNORMAL HIGH

## 2019-08-28 LAB — ANA+ENA+DNA/DS+SCL 70+SJOSSA/B
ANA Titer 1: POSITIVE — AB
ENA RNP Ab: <0.2 AI (ref 0.0–0.9)
ENA SM Ab Ser-aCnc: <0.2 AI (ref 0.0–0.9)
ENA SSA (RO) Ab: <0.2 AI (ref 0.0–0.9)
ENA SSB (LA) Ab: <0.2 AI (ref 0.0–0.9)
Scleroderma (Scl-70) (ENA) Antibody, IgG: <0.2 AI (ref 0.0–0.9)
dsDNA Ab: <1 IU/mL (ref 0–9)

## 2019-08-28 NOTE — Progress Notes (Signed)
Internal Medicine Clinic Attending  Case discussed with Dr. Krienke at the time of the visit.  We reviewed the resident's history and exam and pertinent patient test results.  I agree with the assessment, diagnosis, and plan of care documented in the resident's note.    

## 2019-09-23 ENCOUNTER — Ambulatory Visit: Payer: Self-pay | Admitting: Internal Medicine

## 2019-09-24 NOTE — Telephone Encounter (Signed)
Appears monitor has not been downloaded at this time Will forward to monitor department to follow up on the this./cy

## 2019-09-24 NOTE — Telephone Encounter (Signed)
Patient is requesting to discuss monitor results. Please call. 

## 2019-10-01 ENCOUNTER — Ambulatory Visit: Payer: Self-pay | Admitting: Student

## 2019-10-01 ENCOUNTER — Encounter: Payer: Self-pay | Admitting: Student

## 2019-10-01 ENCOUNTER — Other Ambulatory Visit: Payer: Self-pay

## 2019-10-01 DIAGNOSIS — R0789 Other chest pain: Secondary | ICD-10-CM

## 2019-10-01 NOTE — Progress Notes (Signed)
   CC: Chest discomfort  HPI:  Tiffany Hughes is a 44 y.o. female with history document below presents today for follow up of her chest discomfort, fatigue, and malaise.   She has been having episodes of chest fluttering, shortness of breath, palpitations, exercise intolerance, and general fatigue for about 3 months. States that these episodes are less frequent as she has been limiting activities, avoiding standing for long periods, and frequently taking breaks to sit or lie down. Has continues to drink about 1 gallon of water with about a teaspoon of salt which she feels lessens her symptoms. Has tried to increase exercise such as leg lifts once and had no symptoms. She denies joint pain, nausea, vomiting, fever, chills, and headaches. Please refer to problem based charting for further details and assessment and plan of current problem and chronic medical conditions.   Past Medical History:  Diagnosis Date  . Granuloma inguinale, female   . Granuloma of lacrimal passage, right   . Melanoma in situ [facility])    Review of Systems:  Review of Systems  Constitutional: Positive for malaise/fatigue. Negative for chills, fever and weight loss.  Eyes: Negative for blurred vision.  Respiratory: Positive for shortness of breath. Negative for cough.   Cardiovascular: Positive for chest pain and palpitations. Negative for leg swelling.  Gastrointestinal: Negative for abdominal pain, nausea and vomiting.  Musculoskeletal: Negative for joint pain.  Skin:       Facial flushing  Neurological: Positive for tingling. Negative for dizziness and headaches.  Psychiatric/Behavioral: Negative for depression. The patient is not nervous/anxious.     Physical Exam:  Vitals:   10/01/19 1327  BP: 120/75  Pulse: 77  Temp: 98.4 F (36.9 C)  TempSrc: Oral  SpO2: 99%  Weight: 129 lb 1.6 oz (58.6 kg)  Height: 5\' 5"  (1.651 m)   Physical Exam  Constitutional: Appears well-developed and well-nourished. No  distress.  HENT:  Head: Normocephalic and atraumatic.  Eyes: Conjunctivae are normal.  Cardiovascular: Normal rate, regular rhythm and normal heart sounds.  Respiratory: Effort normal and breath sounds normal. No respiratory distress. No wheezes.  GI: Soft. Bowel sounds are normal. No distension. There is no tenderness.  Musculoskeletal: No swelling tenderness or deformity, normal ROM  Neurological: No focal deficit, aox3 Skin: Warm dry, slight flushing of bilateral cheeks with nasolabial sparing Psychiatric: Normal mood and affect. Behavior is normal.   Assessment & Plan:   See Encounters Tab for problem based charting.  Patient seen with Dr. 

## 2019-10-01 NOTE — Patient Instructions (Addendum)
It was a pleasure seeing you in clinic. Please continue to monitor your symptoms and resume activities as tolerated. Your symptoms may gradually improve on their own with time. Please let us know if symptoms worsen or new symptoms develop. Follow up with Rheumatology.  If you have any questions or concerns, please call our clinic at [phone] between 9am-5pm and after hours call [phone] and ask for the internal medicine resident on call. If you feel you are having a medical emergency please call 911.   Thank you, we look forward to helping you remain healthy!

## 2019-10-01 NOTE — Assessment & Plan Note (Addendum)
Patient continues to have symptoms although diminished in severity and frequency since last visit. Still has days with worse symptoms, but improve with resting.  She is able to identify triggers to symptoms such as prolonged standing and increased activity and is able to modify behaviors to avoid symptoms. Able to tolerate some exercise such as leg lifts. States she was feeling significant anxiety with symptoms initially, but feeling less anxious with workup ruling out life threatening conditions. She denies joint pain, nausea, vomiting, fever, chills, and headaches. Negative orthostatics when examined, although patient was symptomatic while standing. Testing for ds-DNA, SCL, anti-ro, and anti-la negative. Given nonspecific finding autoimmune disease appears less likely, however will still have her evaluated by Rheumatology in November. Appears her symptoms are gradually improving. Will have patient continue to monitor symptoms and gradually resume everyday activiities as tolerated.   -Rheumatology appointment in November -Follow up with pulmonology and cardiology

## 2019-10-02 NOTE — Progress Notes (Signed)
Internal Medicine Clinic Attending  I saw and evaluated the patient.  I personally confirmed the key portions of the history and exam documented by Dr. Elaina Pattee and I reviewed pertinent patient test results.  The assessment, diagnosis, and plan were formulated together and I agree with the documentation in the resident's note.  Of historical interest is that symptoms began after she received the J and J vaccine.  She has never had Covid to her knowledge.  Thus far there have been no objective findings to further direct a specific diagnostic plan, and the improvement of symptoms is encouraging.  However, her symptoms have been quite debilitating for this usually very active woman, and continued f/u to symptom resolution is important.  She agrees to notify us if any new problems or worsening occurs which would warrant another evaluation.  She is not dehydrated.  No findings c/w POTS.  No dysrhythmia on monitoring.  Screening blood tests unrevealing.

## 2019-11-25 ENCOUNTER — Ambulatory Visit (INDEPENDENT_AMBULATORY_CARE_PROVIDER_SITE_OTHER): Payer: Self-pay | Admitting: Cardiovascular Disease

## 2019-11-25 ENCOUNTER — Encounter: Payer: Self-pay | Admitting: Cardiovascular Disease

## 2019-11-25 ENCOUNTER — Other Ambulatory Visit: Payer: Self-pay

## 2019-11-25 DIAGNOSIS — R002 Palpitations: Secondary | ICD-10-CM

## 2019-11-25 DIAGNOSIS — E782 Mixed hyperlipidemia: Secondary | ICD-10-CM

## 2019-11-25 DIAGNOSIS — R0789 Other chest pain: Secondary | ICD-10-CM

## 2019-11-25 DIAGNOSIS — R0600 Dyspnea, unspecified: Secondary | ICD-10-CM

## 2019-11-25 DIAGNOSIS — R0609 Other forms of dyspnea: Secondary | ICD-10-CM

## 2019-11-25 NOTE — Assessment & Plan Note (Signed)
History of dyspnea on exertion beginning several weeks after her Covid vaccine with a normal 2D echo.  She is fairly active.  Her dyspnea has greatly improved over the last several months.

## 2019-11-25 NOTE — Assessment & Plan Note (Signed)
Atypical chest pain beginning several weeks after her cooker Covid vaccine 06/10/2019.  She was initially seen at [facility] where her D-dimer was mildly elevated and a chest CTA was negative for PE.  She did have a coronary calcium score performed 08/27/2019 here which was 0 with no evidence of CAD.  Her chest pain is gradually improved over the last several months.

## 2019-11-25 NOTE — Assessment & Plan Note (Signed)
History of palpitations which began several weeks after her current Covid vaccine.  Monitor showed PACs and PVCs.  She does drink 1 cup of coffee a morning.  Her symptoms have gradually improved over the last several months.

## 2019-11-25 NOTE — Assessment & Plan Note (Signed)
Mild hyperlipidemia with an LDL of 127 performed 12/04/2018.  We talked about dietary modification.  This can be followed by her PCP.  Ideally her LDL should be less than 100 to comply with guidelines for primary prevention.

## 2019-11-25 NOTE — Patient Instructions (Signed)
Medication Instructions:  Your physician recommends that you continue on your current medications as directed. Please refer to the Current Medication list given to you today.  *If you need a refill on your cardiac medications before your next appointment, please call your pharmacy*   Follow-Up: At [facility], you and your health needs are our priority.  As part of our continuing mission to provide you with exceptional heart care, we have created designated Provider Care Teams.  These Care Teams include your primary Cardiologist (physician) and Advanced Practice Providers (APPs -  Physician Assistants and Nurse Practitioners) who all work together to provide you with the care you need, when you need it.  We recommend signing up for the patient portal called "MyChart".  Sign up information is provided on this After Visit Summary.  MyChart is used to connect with patients for Virtual Visits (Telemedicine).  Patients are able to view lab/test results, encounter notes, upcoming appointments, etc.  Non-urgent messages can be sent to your provider as well.   To learn more about what you can do with MyChart, go to [email or web address].    Your next appointment:   6 month(s)  The format for your next appointment:   In Person  Provider:   Nanetta Batty, MD   Other Instructions

## 2019-11-25 NOTE — Progress Notes (Signed)
11/25/2019 Tiffany Hughes   1975-10-31  [id number]  Primary Physician Chevis Pretty, MD Primary Cardiologist: Runell Gess MD Nicholes Calamity, [place]  HPI:  Tiffany Hughes is a 44 y.o. thin and fit appearing married Caucasian female (originally from [place]) mother of 2 children who is referred by Dr. Effie Shy from the emergency room after he recently saw her for chest pain 07/21/2019.    I last saw her in the office 08/21/2019.  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  She has no cardiac risk factors.  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  She exercises frequently without symptoms prior to April.  She recently received the Covid change a vaccine on 06/10/2019 and developed chest pain approximately 3 weeks later on 426/21.  She pain occurred daily.  Is unrelated to activity.  It radiated to her right neck and jaw.  She was seen in the [facility] ER on 5/11 and was noted to have a mildly elevated D-dimer.  Chest CT showed no PE and no coronary calcification.  She was seen again in the emergency room by Dr. Effie Shy on 07/21/2019 with chest pain.  Her work-up was unrevealing.  She continues to have symptoms of dyspnea, palpitations and atypical chest pain.  A 2D echocardiogram performed 08/06/2019 was entirely normal.  Since I saw her she did have a coronary CTA performed 08/27/2019 revealed a coronary calcium score of 0 with no evidence of CAD.  Event monitor showed only occasional PACs and PVCs.  Her symptoms of atypical chest pain, dyspnea and palpitations have gradually improved over the last several months.   Current Meds  Medication Sig  . CALCIUM PO Take 1,200 mg by mouth. Patient takes Critical calcium citrate  . Cholecalciferol (VITAMIN D3) 125 MCG (5000 UT) CAPS Take by mouth.  . Flaxseed, Linseed, (FLAXSEED OIL PO) Take 3,000 mg by mouth.   . magnesium (MAGTAB) 84 MG ( ) TBCR SR tablet Take 84  mg by mouth.  . Omega-3 Fatty Acids (FISH OIL PO) Take 3,000 mg by mouth.   . Prenatal Vit-Fe Fumarate-FA (PRENATAL MULTIVITAMIN) TABS tablet Take 1 tablet by mouth daily at 12 noon.  . terbinafine (LAMISIL) 1 % cream Apply 1 application topically 2 (two) times daily. Patient states that it is a liquid that she drips on her toenail     Allergies  Allergen Reactions  . Polysporin [Bacitracin-Polymyxin B] Rash    Social History   Socioeconomic History  . Marital status: Married    Spouse name: Not on file  . Number of children: Not on file  . Years of education: Not on file  . Highest education level: Not on file  Occupational History  . 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 Smoking status: Never Smoker  . Smokeless tobacco: Never Used  Substance and Sexual Activity  . Alcohol use: Yes    Comment: ocassionally  . Drug use: Never  . Sexual activity: Not on file  Other Topics Concern  . Not on file  Social History Narrative  . Not on file   Social Determinants of Health   Financial Resource Strain:   . Difficulty of Paying Living Expenses: Not on file  Food Insecurity:   . Worried About Programme researcher, broadcasting/film/video in the Last Year: Not on file  . Ran Out of Food in the Last Year: Not on file  Transportation Needs:   .  Lack of Transportation (Medical): Not on file  . Lack of Transportation (Non-Medical): Not on file  Physical Activity:   . Days of Exercise per Week: Not on file  . Minutes of Exercise per Session: Not on file  Stress:   . Feeling of Stress : Not on file  Social Connections:   . Frequency of Communication with Friends and Family: Not on file  . Frequency of Social Gatherings with Friends and Family: Not on file  . Attends Religious Services: Not on file  . Active Member of Clubs or Organizations: Not on file  . Attends Banker Meetings: Not on file  . Marital Status: Not on file  Intimate Partner Violence:   . Fear of Current or  Ex-Partner: Not on file  . Emotionally Abused: Not on file  . Physically Abused: Not on file  . Sexually Abused: Not on file     Review of Systems: General: negative for chills, fever, night sweats or weight changes.  Cardiovascular: negative for chest pain, dyspnea on exertion, edema, orthopnea, palpitations, paroxysmal nocturnal dyspnea or shortness of breath Dermatological: negative for rash Respiratory: negative for cough or wheezing Urologic: negative for hematuria Abdominal: negative for nausea, vomiting, diarrhea, bright red blood per rectum, melena, or hematemesis Neurologic: negative for visual changes, syncope, or dizziness All other systems reviewed and are otherwise negative except as noted above.    Blood pressure 110/70, pulse 83, height 5\' 5"  (1.651 m), weight 133 lb (60.3 kg), SpO2 99 %.  General appearance: alert and no distress Neck: no adenopathy, no carotid bruit, no JVD, supple, symmetrical, trachea midline and thyroid not enlarged, symmetric, no tenderness/mass/nodules Lungs: clear to auscultation bilaterally Heart: regular rate and rhythm, S1, S2 normal, no murmur, click, rub or gallop Extremities: extremities normal, atraumatic, no cyanosis or edema Pulses: 2+ and symmetric Skin: Skin color, texture, turgor normal. No rashes or lesions Neurologic: Alert and oriented X 3, normal strength and tone. Normal symmetric reflexes. Normal coordination and gait  EKG not performed today  ASSESSMENT AND PLAN:   Hyperlipidemia Mild hyperlipidemia with an LDL of 127 performed 12/04/2018.  We talked about dietary modification.  This can be followed by her PCP.  Ideally her LDL should be less than 100 to comply with guidelines for primary prevention.  Chest discomfort Atypical chest pain beginning several weeks after her cooker Covid vaccine 06/10/2019.  She was initially seen at [facility] where her D-dimer was mildly elevated and a chest CTA was negative for  PE.  She did have a coronary calcium score performed 08/27/2019 here which was 0 with no evidence of CAD.  Her chest pain is gradually improved over the last several months.  Dyspnea on exertion History of dyspnea on exertion beginning several weeks after her Covid vaccine with a normal 2D echo.  She is fairly active.  Her dyspnea has greatly improved over the last several months.  Palpitations History of palpitations which began several weeks after her current Covid vaccine.  Monitor showed PACs and PVCs.  She does drink 1 cup of coffee a morning.  Her symptoms have gradually improved over the last several months.      08/29/2019 MD FACP,FACC,FAHA, [facility] 11/25/2019 10:04 AM

## 2019-12-28 NOTE — Progress Notes (Signed)
Office Visit Note  Patient: Tiffany Hughes             Date of Birth: 1975/04/06           MRN: [id number]             PCP: Dewayne Hatch, MD Referring: Oda Kilts, MD Visit Date: 01/11/2020 Occupation: _0 @  Subjective:  No chief complaint on file.   History of Present Illness: Tiffany Hughes is a 44 y.o. female seen in consultation per request of her PCP.  According the patient her symptoms a started on April 26 with tingling in her feet and chest pain.  At that time her initial work-up and labs were normal.  She states on Jul 14, 2019 she went to the emergency room due to persistent pain and shortness of breath.  She had a CT scan of her chest which was negative for pulmonary embolism and the labs were normal.  1 week later she went to the emergency room again and the work-up was negative.  She was referred to a cardiologist who according the patient did echocardiogram, CT angio and also did heart monitoring.  The work-up was completely negative.  She was referred to a pulmonologist.  Who did walk test and Pap socks which was within normal limits per patient.  She states at the time the labs showed positive ANA for that reason she was referred to me.  She is gravida 4, para 2, miscarriages 2.  There is history of sarcoidosis in her father hemosiderosis in her mother and her brother was recently diagnosed with psoriasis versus lichen sclerosis.  There is no history of oral ulcers, nasal ulcers, Raynaud's phenomenon, joint inflammation.  She gives history of photosensitivity and redness on her face.  She also has dry eyes.  Activities of Daily Living:  Patient reports morning stiffness for 0 minutes.   Patient Reports nocturnal pain.  Difficulty dressing/grooming: Denies Difficulty climbing stairs: Denies Difficulty getting out of chair: Denies Difficulty using hands for taps, buttons, cutlery, and/or writing: Denies  Review of Systems  Constitutional: Positive for  fatigue.  HENT: Negative for mouth sores, mouth dryness and nose dryness.   Eyes: Positive for dryness. Negative for pain and itching.  Respiratory: Positive for shortness of breath. Negative for difficulty breathing.        Occasional with walking  Cardiovascular: Positive for chest pain and palpitations.  Gastrointestinal: Positive for constipation. Negative for blood in stool and diarrhea.  Endocrine: Negative for increased urination.  Genitourinary: Negative for difficulty urinating.  Musculoskeletal: Negative for arthralgias, joint pain, joint swelling, myalgias, morning stiffness, muscle tenderness and myalgias.  Skin: Positive for redness and sensitivity to sunlight. Negative for rash, hair loss and ulcers.  Allergic/Immunologic: Negative for susceptible to infections.  Neurological: Positive for headaches and parasthesias. Negative for dizziness, numbness, memory loss and weakness.  Hematological: Negative for bruising/bleeding tendency.  Psychiatric/Behavioral: Positive for sleep disturbance. Negative for depressed mood and confusion. The patient is not nervous/anxious.     PMFS History:  Patient Active Problem List   Diagnosis Date Noted   Dyspnea on exertion 08/21/2019   Palpitations 08/21/2019   Hyperlipidemia 08/21/2019   Chest discomfort 07/29/2019    Past Medical History:  Diagnosis Date   Granuloma inguinale, female    Granuloma of lacrimal passage, right    Melanoma in situ [facility])     Family History  Problem Relation Age of Onset   Hepatitis Mother  idiopathic obstruction airway disease, venous insufficiency   Osteoporosis Mother    Hepatitis C Father    Sarcoidosis Father    Stroke Maternal Grandmother    Melanoma Paternal Aunt    Deep vein thrombosis Maternal Uncle    Asthma Son    Allergy (severe) Son    Asthma Son    Past Surgical History:  Procedure Laterality Date   granloma removal      3 surgeries    MELANOMA EXCISION      11 melanoma total, 13 procedures   Social History   Social History Narrative   Not on file   Immunization History  Administered Date(s) Administered   Engineer, 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  Constitutional:      Appearance: She is well-developed.  HENT:     Head: Normocephalic and atraumatic.  Eyes:     Conjunctiva/sclera: Conjunctivae normal.  Cardiovascular:     Rate and Rhythm: Normal rate and regular rhythm.     Heart sounds: Normal heart sounds.  Pulmonary:     Effort: Pulmonary effort is normal.     Breath sounds: Normal breath sounds.  Abdominal:     General: Bowel sounds are normal.     Palpations: Abdomen is soft.  Musculoskeletal:     Cervical back: Normal range of motion.  Lymphadenopathy:     Cervical: No cervical adenopathy.  Skin:    General: Skin is warm and dry.     Capillary Refill: Capillary refill takes less than 2 seconds.     Comments: Facial erythema noted over the entire face.  No sparing of nasolabial folds was noted.  No nailbed capillary changes were noted.  No sclerodactyly was noted.  Neurological:     Mental Status: She is alert and oriented to person, place, and time.  Psychiatric:        Behavior: Behavior normal.      Musculoskeletal Exam: C-spine thoracic and lumbar spine with good range of motion.  Shoulder joints, elbow joints, wrist joints, MCPs PIPs and DIPs in good range of motion with no synovitis.  She is some prominence of DIP joints.  Hip joints, knee joints, ankles, MTPs and PIPs with good range of motion with no synovitis.  CDAI Exam: CDAI Score: -- Patient Global: --; Provider Global: -- Swollen: --; Tender: -- Joint Exam 01/11/2020   No joint exam has been  documented for this visit   There is currently no information documented on the homunculus. Go to the Rheumatology activity and complete the homunculus joint exam.  Investigation: No additional findings.  Imaging: No results found.  Recent Labs: Lab Results  Component Value Date   WBC 10.8 (H) 08/17/2019   HGB 13.0 08/17/2019   PLT 362.0 08/17/2019   NA 139 08/21/2019   K 4.3 08/21/2019   CL 103 08/21/2019   CO2 23 08/21/2019   GLUCOSE 84 08/21/2019   BUN 11 08/21/2019   CREATININE 0.58 08/21/2019   CALCIUM 9.7 08/21/2019   GFRAA 130 08/21/2019    Speciality Comments: No specialty comments available.  Procedures:  No procedures performed Allergies: Polysporin [bacitracin-polymyxin b]   Assessment /  Plan:     Visit Diagnoses: Positive ANA (antinuclear antibody) - 08/17/19: ANA 1:320NS, CRP<1, ESR 5, RF<14, CCP<166/23/21: ANA 1:320H, dsDNA-, RNP-, SM-, scl-70-, Ro-, La- -patient was referred for evaluation of positive ANA.  There is no history of oral ulcers, nasal ulcers, malar rash, Joint inflammation or Raynaud's phenomenon.  She gives history of dry eyes, sun sensitivity and facial erythema.  At this point she does not meet criteria for any particular autoimmune disease.  I will obtain additional labs today.  ENA panel was completely negative.  Plan: CBC with Differential/Platelet, COMPLETE METABOLIC PANEL WITH GFR, Urinalysis, Routine w reflex microscopic, ANA, C3 and C4, Beta-2 glycoprotein antibodies, Cardiolipin antibodies, IgG, IgM, IgA, Lupus Anticoagulant Eval w/Reflex  Other fatigue -she gives history of fatigue and shortness of breath.  Plan: CK  Palpitations-she has palpitations with shortness of breath.  Dyspnea on exertion-she had extensive work-up by cardiologist and pulmonologist.  Chest discomfort-she gives history of episodic chest discomfort.  She had a chest x-ray on Jul 21, 2019 which was unremarkable.  Mixed hyperlipidemia  Family history of  sarcoidosis-her father has sarcoidosis.  She states her mother has hemosiderosis.  According the patient her brother may have psoriasis or lichen sclerosis.  The work-up is not completed.  History of melanoma - 10/2018 excision only.  She is closely followed by a dermatologist every 6 months.  I have advised her to discuss facial erythema with her dermatologist.  Use of sunscreen was discussed.  Educated about COVID-19 virus infection-she had from vaccination for COVID-19.  Booster was discussed.  Information was placed in the AVS.  Orders: Orders Placed This Encounter  Procedures   CBC with Differential/Platelet   COMPLETE METABOLIC PANEL WITH GFR   Urinalysis, Routine w reflex microscopic   CK   ANA   C3 and C4   Beta-2 glycoprotein antibodies   Cardiolipin antibodies, IgG, IgM, IgA   Lupus Anticoagulant Eval w/Reflex   No orders of the defined types were placed in this encounter.     Follow-Up Instructions: Return for Positive ANA.   Bo Merino, MD  Note - This record has been created using Editor, commissioning.  Chart creation errors have been sought, but may not always  have been located. Such creation errors do not reflect on  the standard of medical care.

## 2020-01-11 ENCOUNTER — Ambulatory Visit: Payer: Self-pay | Admitting: Rheumatology

## 2020-01-11 ENCOUNTER — Encounter: Payer: Self-pay | Admitting: Rheumatology

## 2020-01-11 ENCOUNTER — Other Ambulatory Visit: Payer: Self-pay

## 2020-01-11 VITALS — BP 137/86 | HR 70 | Resp 14 | Ht 65.0 in | Wt 137.0 lb

## 2020-01-11 DIAGNOSIS — E782 Mixed hyperlipidemia: Secondary | ICD-10-CM

## 2020-01-11 DIAGNOSIS — R0600 Dyspnea, unspecified: Secondary | ICD-10-CM

## 2020-01-11 DIAGNOSIS — R5383 Other fatigue: Secondary | ICD-10-CM

## 2020-01-11 DIAGNOSIS — R0609 Other forms of dyspnea: Secondary | ICD-10-CM

## 2020-01-11 DIAGNOSIS — Z7189 Other specified counseling: Secondary | ICD-10-CM

## 2020-01-11 DIAGNOSIS — Z832 Family history of diseases of the blood and blood-forming organs and certain disorders involving the immune mechanism: Secondary | ICD-10-CM

## 2020-01-11 DIAGNOSIS — R0789 Other chest pain: Secondary | ICD-10-CM

## 2020-01-11 DIAGNOSIS — R002 Palpitations: Secondary | ICD-10-CM

## 2020-01-11 DIAGNOSIS — Z85820 Personal history of malignant melanoma of skin: Secondary | ICD-10-CM

## 2020-01-11 DIAGNOSIS — R768 Other specified abnormal immunological findings in serum: Secondary | ICD-10-CM

## 2020-01-11 NOTE — Patient Instructions (Signed)

## 2020-01-14 LAB — CBC WITH DIFFERENTIAL/PLATELET
Absolute Monocytes: 410 cells/uL (ref 200–950)
Basophils Absolute: 68 cells/uL (ref 0–200)
Basophils Relative: 1.2 %
Eosinophils Absolute: 63 cells/uL (ref 15–500)
Eosinophils Relative: 1.1 %
HCT: 39.6 % (ref 35.0–45.0)
Hemoglobin: 13.5 g/dL (ref 11.7–15.5)
Lymphs Abs: 2126 cells/uL (ref 850–3900)
MCH: 30.9 pg (ref 27.0–33.0)
MCHC: 34.1 g/dL (ref 32.0–36.0)
MCV: 90.6 fL (ref 80.0–100.0)
MPV: 10.7 fL (ref 7.5–12.5)
Monocytes Relative: 7.2 %
Neutro Abs: 3032 cells/uL (ref 1500–7800)
Neutrophils Relative %: 53.2 %
Platelets: 397 10*3/uL (ref 140–400)
RBC: 4.37 10*6/uL (ref 3.80–5.10)
RDW: 11.6 % (ref 11.0–15.0)
Total Lymphocyte: 37.3 %
WBC: 5.7 10*3/uL (ref 3.8–10.8)

## 2020-01-14 LAB — LUPUS ANTICOAGULANT EVAL W/ REFLEX
PTT-LA Screen: 34 s (ref <=40)
dRVVT: 39 s (ref <=45)

## 2020-01-14 LAB — BETA-2 GLYCOPROTEIN ANTIBODIES
Beta-2 Glyco 1 IgA: <2 U/mL
Beta-2 Glyco 1 IgM: <2 U/mL
Beta-2 Glyco I IgG: <2 U/mL

## 2020-01-14 LAB — CK: Total CK: 52 U/L (ref 29–143)

## 2020-01-14 LAB — COMPLETE METABOLIC PANEL WITH GFR
AG Ratio: 1.7 (calc) (ref 1.0–2.5)
ALT: 13 U/L (ref 6–29)
AST: 14 U/L (ref 10–30)
Albumin: 4.5 g/dL (ref 3.6–5.1)
Alkaline phosphatase (APISO): 48 U/L (ref 31–125)
BUN: 11 mg/dL (ref 7–25)
CO2: 30 mmol/L (ref 20–32)
Calcium: 10.1 mg/dL (ref 8.6–10.2)
Chloride: 107 mmol/L (ref 98–110)
Creat: 0.53 mg/dL (ref 0.50–1.10)
GFR, Est African American: 134 mL/min/{1.73_m2} (ref >=60)
GFR, Est Non African American: 115 mL/min/{1.73_m2} (ref >=60)
Globulin: 2.6 g/dL (calc) (ref 1.9–3.7)
Glucose, Bld: 85 mg/dL (ref 65–139)
Potassium: 4.3 mmol/L (ref 3.5–5.3)
Sodium: 142 mmol/L (ref 135–146)
Total Bilirubin: 0.8 mg/dL (ref 0.2–1.2)
Total Protein: 7.1 g/dL (ref 6.1–8.1)

## 2020-01-14 LAB — URINALYSIS, ROUTINE W REFLEX MICROSCOPIC
Bilirubin Urine: NEGATIVE
Glucose, UA: NEGATIVE
Hgb urine dipstick: NEGATIVE
Ketones, ur: NEGATIVE
Leukocytes,Ua: NEGATIVE
Nitrite: NEGATIVE
Protein, ur: NEGATIVE
Specific Gravity, Urine: 1.01 (ref 1.001–1.03)
pH: 7.5 (ref 5.0–8.0)

## 2020-01-14 LAB — CARDIOLIPIN ANTIBODIES, IGG, IGM, IGA
Anticardiolipin IgA: <2 APL-U/mL
Anticardiolipin IgG: <2 GPL-U/mL
Anticardiolipin IgM: <2 MPL-U/mL

## 2020-01-14 LAB — ANA: Anti Nuclear Antibody (ANA): POSITIVE — AB

## 2020-01-14 LAB — C3 AND C4
C3 Complement: 83 mg/dL (ref 83–193)
C4 Complement: 16 mg/dL (ref 15–57)

## 2020-01-14 LAB — ANTI-NUCLEAR AB-TITER (ANA TITER)
ANA TITER: 1:320 {titer} — ABNORMAL HIGH
ANA Titer 1: 1:40 {titer} — ABNORMAL HIGH

## 2020-01-29 NOTE — Progress Notes (Signed)
Office Visit Note  Patient: Tiffany Hughes             Date of Birth: 06/20/1975           MRN: [id number]             PCP: Dewayne Hatch, MD Referring: Dewayne Hatch, * Visit Date: 02/08/2020 Occupation: _0 @  Subjective:  +ANA.   History of Present Illness: Tiffany Hughes is a 44 y.o. female with history of positive ANA.  She states there has been gradual improvement in her fatigue but she still suffers from fatigue.  She states sometimes her feet will turn of blue discoloration.  She continues to have dry eyes.  She denies any joint pain or joint swelling.  She states she has been experiencing paresthesias in her hands and feet especially at nighttime.  She also notices tremors in her chest muscles in her facial muscles.  She states she has been exercising and walking some.  Her brisk walking causes chest discomfort.  Activities of Daily Living:  Patient reports morning stiffness for 0 minutes.   Patient Denies nocturnal pain.  Difficulty dressing/grooming: Denies Difficulty climbing stairs: Denies Difficulty getting out of chair: Denies Difficulty using hands for taps, buttons, cutlery, and/or writing: Denies  Review of Systems  Constitutional: Positive for fatigue.  HENT: Negative for mouth sores, mouth dryness and nose dryness.   Eyes: Positive for visual disturbance and dryness. Negative for pain and itching.  Respiratory: Negative for cough, hemoptysis, shortness of breath and difficulty breathing.   Cardiovascular: Positive for chest pain. Negative for palpitations and swelling in legs/feet.  Gastrointestinal: Negative for abdominal pain, blood in stool, constipation and diarrhea.  Endocrine: Positive for increased urination.  Genitourinary: Negative for painful urination.  Musculoskeletal: Positive for muscle weakness. Negative for arthralgias, joint pain, joint swelling, myalgias, morning stiffness, muscle tenderness and myalgias.  Skin: Positive for  color change and redness. Negative for rash.  Allergic/Immunologic: Negative for susceptible to infections.  Neurological: Positive for headaches, parasthesias and weakness. Negative for dizziness, numbness and memory loss.  Hematological: Negative for swollen glands.  Psychiatric/Behavioral: Negative for confusion and sleep disturbance.    PMFS History:  Patient Active Problem List   Diagnosis Date Noted  . Dyspnea on exertion 08/21/2019  . Palpitations 08/21/2019  . Hyperlipidemia 08/21/2019  . Chest discomfort 07/29/2019    Past Medical History:  Diagnosis Date  . Granuloma inguinale, female   . Granuloma of lacrimal passage, right   . Melanoma in situ [facility])     Family History  Problem Relation Age of Onset  . Hepatitis Mother        idiopathic obstruction airway disease, venous insufficiency  . Osteoporosis Mother   . Hepatitis C Father   . Sarcoidosis Father   . Stroke Maternal Grandmother   . Melanoma Paternal Aunt   . Deep vein thrombosis Maternal Uncle   . Asthma Son   . Allergy (severe) Son   . Asthma Son    Past Surgical History:  Procedure Laterality Date  . granloma removal      3 surgeries   . MELANOMA EXCISION     11 melanoma total, 13 procedures   Social History   Social History Narrative  . Not on file   Immunization History  Administered Date(s) Administered  . Janssen (J&J) SARS-COV-2 Vaccination 06/10/2019     Objective: Vital Signs: BP 117/70 (BP Location: Left Arm, Patient Position: Sitting, Cuff Size: Small)   Pulse 68  Wt 138 lb 6.4 oz (62.8 kg)   BMI 23.03 kg/m    Physical Exam Vitals and nursing note reviewed.  Constitutional:      Appearance: She is well-developed.  HENT:     Head: Normocephalic and atraumatic.  Eyes:     Conjunctiva/sclera: Conjunctivae normal.  Cardiovascular:     Rate and Rhythm: Normal rate and regular rhythm.     Heart sounds: Normal heart sounds.  Pulmonary:     Effort: Pulmonary effort is normal.      Breath sounds: Normal breath sounds.  Abdominal:     General: Bowel sounds are normal.     Palpations: Abdomen is soft.  Musculoskeletal:     Cervical back: Normal range of motion.  Lymphadenopathy:     Cervical: No cervical adenopathy.  Skin:    General: Skin is warm and dry.     Capillary Refill: Capillary refill takes less than 2 seconds.     Comments: No sclerodactyly or nailbed capillary changes were noted.  Neurological:     Mental Status: She is alert and oriented to person, place, and time.  Psychiatric:        Behavior: Behavior normal.      Musculoskeletal Exam: C-spine, thoracic and lumbar spine with good range of motion.  Shoulder joints, elbow joints, wrist joints, MCPs PIPs and DIPs with good range of motion with no synovitis.  Hip joints, knee joints, ankles, MTPs and PIPs with good range of motion with no synovitis.  CDAI Exam: CDAI Score: -- Patient Global: --; Provider Global: -- Swollen: --; Tender: -- Joint Exam 02/08/2020   No joint exam has been documented for this visit   There is currently no information documented on the homunculus. Go to the Rheumatology activity and complete the homunculus joint exam.  Investigation: No additional findings.  Imaging: No results found.  Recent Labs: Lab Results  Component Value Date   WBC 5.7 01/11/2020   HGB 13.5 01/11/2020   PLT 397 01/11/2020   NA 142 01/11/2020   K 4.3 01/11/2020   CL 107 01/11/2020   CO2 30 01/11/2020   GLUCOSE 85 01/11/2020   BUN 11 01/11/2020   CREATININE 0.53 01/11/2020   BILITOT 0.8 01/11/2020   AST 14 01/11/2020   ALT 13 01/11/2020   PROT 7.1 01/11/2020   CALCIUM 10.1 01/11/2020   GFRAA 134 01/11/2020  11/08/ 2021 UA negative, ANA 1: 320NS, C3-C4 normal, beta-2 GP 1 -, anticardiolipin negative, lupus anticoagulant negative, CK 52  08/17/19: ANA 1:320NS, CRP<1, ESR 5, RF<14, CCP<16  08/26/19: ANA 1:320H, dsDNA-, RNP-, SM-, scl-70-, Ro-, La- -  Speciality Comments: No  specialty comments available.  Procedures:  No procedures performed Allergies: Polysporin [bacitracin-polymyxin b]   Assessment / Plan:     Visit Diagnoses: Positive ANA (antinuclear antibody) - Positive ANA, ENA negative, history of dry eyes, sun sensitivity, Raynaud's and facial erythema.  She has mild sicca symptoms.  All autoimmune work-up otherwise is negative.  The labs were discussed with patient at length.  I have advised her to contact me if she develops any new symptoms.  I would like to monitor her over time.  There was no evidence of Raynauds on my examination today.  She had good capillary refill and warm hands and feet to touch.  No nailbed capillary changes were noted.  Other fatigue - CK is normal.  Palpitations - History of palpitations with shortness of breath.  Dyspnea on exertion - She had extensive work-up by  the Dr. Ina Homes and by Dr. Gwenlyn Found  Chest discomfort - Chest x-ray on Jul 21, 2019 was unremarkable.  She gives history of episodic chest discomfort especially with exertion.  Paresthesias-she complains of paresthesias in her hands and her feet at nighttime.  I will refer her to neurology for evaluation.  Occasional tremors-she also gives history of tremors in the muscles of her chest and her facial muscles.  Neurology evaluation will be helpful.  Mixed hyperlipidemia  History of melanoma  Family history of sarcoidosis  Orders: No orders of the defined types were placed in this encounter.  No orders of the defined types were placed in this encounter.    Follow-Up Instructions: Return in about 6 months (around 08/08/2020) for +ANA.   Bo Merino, MD  Note - This record has been created using Editor, commissioning.  Chart creation errors have been sought, but may not always  have been located. Such creation errors do not reflect on  the standard of medical care.

## 2020-02-08 ENCOUNTER — Other Ambulatory Visit: Payer: Self-pay

## 2020-02-08 ENCOUNTER — Encounter: Payer: Self-pay | Admitting: Rheumatology

## 2020-02-08 ENCOUNTER — Ambulatory Visit: Payer: Self-pay | Admitting: Rheumatology

## 2020-02-08 VITALS — BP 117/70 | HR 68 | Wt 138.4 lb

## 2020-02-08 DIAGNOSIS — R0609 Other forms of dyspnea: Secondary | ICD-10-CM

## 2020-02-08 DIAGNOSIS — E782 Mixed hyperlipidemia: Secondary | ICD-10-CM

## 2020-02-08 DIAGNOSIS — R002 Palpitations: Secondary | ICD-10-CM

## 2020-02-08 DIAGNOSIS — R251 Tremor, unspecified: Secondary | ICD-10-CM

## 2020-02-08 DIAGNOSIS — Z832 Family history of diseases of the blood and blood-forming organs and certain disorders involving the immune mechanism: Secondary | ICD-10-CM

## 2020-02-08 DIAGNOSIS — R202 Paresthesia of skin: Secondary | ICD-10-CM

## 2020-02-08 DIAGNOSIS — Z85820 Personal history of malignant melanoma of skin: Secondary | ICD-10-CM

## 2020-02-08 DIAGNOSIS — R768 Other specified abnormal immunological findings in serum: Secondary | ICD-10-CM

## 2020-02-08 DIAGNOSIS — R5383 Other fatigue: Secondary | ICD-10-CM

## 2020-02-08 DIAGNOSIS — R0789 Other chest pain: Secondary | ICD-10-CM

## 2020-02-08 DIAGNOSIS — R0600 Dyspnea, unspecified: Secondary | ICD-10-CM

## 2020-02-09 ENCOUNTER — Encounter: Payer: Self-pay | Admitting: Neurology

## 2020-02-15 ENCOUNTER — Ambulatory Visit: Payer: Self-pay | Attending: Internal Medicine

## 2020-02-15 DIAGNOSIS — Z23 Encounter for immunization: Secondary | ICD-10-CM

## 2020-02-15 NOTE — Progress Notes (Signed)
   Covid-19 Vaccination Clinic  Name:  CAMIL HAUSMANN    MRN: [id number] DOB: April 22, 1975  02/15/2020  Ms. Montemayor was observed post Covid-19 immunization for 15 minutes without incident. She was provided with Vaccine Information Sheet and instruction to access the V-Safe system.   Ms. Gallego was instructed to call 911 with any severe reactions post vaccine: Marland Kitchen Difficulty breathing  . Swelling of face and throat  . A fast heartbeat  . A bad rash all over body  . Dizziness and weakness   Immunizations Administered    Name Date Dose VIS Date Route   Pfizer COVID-19 Vaccine 02/15/2020  3:01 PM 0.3 mL 12/23/2019 Intramuscular   Manufacturer: [employer], [employer]   Lot: [id number]   NDC: [id number]

## 2020-05-02 ENCOUNTER — Other Ambulatory Visit: Payer: Self-pay

## 2020-05-02 ENCOUNTER — Ambulatory Visit (INDEPENDENT_AMBULATORY_CARE_PROVIDER_SITE_OTHER): Payer: Self-pay | Admitting: Neurology

## 2020-05-02 ENCOUNTER — Encounter: Payer: Self-pay | Admitting: Neurology

## 2020-05-02 VITALS — BP 116/71 | HR 68 | Ht 65.0 in | Wt 138.4 lb

## 2020-05-02 DIAGNOSIS — R202 Paresthesia of skin: Secondary | ICD-10-CM

## 2020-05-02 NOTE — Progress Notes (Signed)
[facility] Neurology Division Clinic Note - Initial Visit   Date: 05/02/20  CHARLES NIESE MRN: [id number] DOB: 06-13-1975   Dear Dr. Maryla Morrow:  Thank you for your kind referral of Tiffany Hughes for consultation of tremors and numbness/tingling. Although her history is well known to you, please allow [place] to reiterate it for the purpose of our medical record. The patient was accompanied to the clinic by self.    History of Present Illness: Tiffany Hughes is a 45 y.o. right-handed female with positive ANA presenting for evaluation of episodic tremors and numbness/tingling..   Starting in May 2021, she noticed tingling sensation in the hands and feet which she is laying down. It occurs about 5 times per week, which is present when she falls asleep and resolved by morning.  She denies weakness in the arms and legs.  She has generalized sense of fatigue.  She also had chest discomfort around the same time.  Cardiology evaluation including echo, CT chest, and cardiac monitoring was normal. In July, she began having trembling sensation over the face and chest.  She does not actually see any muscle twitches or abnormal movement, she senses it inside. She is seeing [facility] for evaluation of POTS.   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  She lives at home with husband and two sons.  She denies stress or anxiety.   Out-side paper records, electronic medical record, and images have been reviewed where available and summarized as:  Lab Results  Component Value Date   ESRSEDRATE 5 08/17/2019   Vitamin B12 04/20/2020:  830  Past Medical History:  Diagnosis Date  . Granuloma inguinale, female   . Granuloma of lacrimal passage, right   . Melanoma in situ [facility])     Past Surgical History:  Procedure Laterality Date  . granloma removal      3 surgeries   . MELANOMA EXCISION     11 melanoma total, 13 procedures     Medications:  Outpatient Encounter  Medications as of 05/02/2020  Medication Sig  . Acetylcarnitine HCl (ACETYL L-CARNITINE) 500 MG CAPS daily.  . Acetylcysteine, Nutrient, 600 MG TABS Take by mouth daily.  Marland Kitchen CALCIUM PO Take 1,200 mg by mouth. Patient takes Critical calcium citrate  . Cholecalciferol (VITAMIN D3) 125 MCG (5000 UT) CAPS Take by mouth.  . Flaxseed, Linseed, (FLAXSEED OIL PO) Take 3,000 mg by mouth.   . magnesium (MAGTAB) 84 MG ( ) TBCR SR tablet Take 84 mg by mouth.  . Omega-3 Fatty Acids (FISH OIL PO) Take 3,000 mg by mouth.   . Prenatal Vit-Fe Fumarate-FA (PRENATAL MULTIVITAMIN) TABS tablet Take 1 tablet by mouth daily at 12 noon.  . terbinafine (LAMISIL) 1 % cream Apply 1 application topically 2 (two) times daily. Patient states that it is a liquid that she drips on her toenail   No facility-administered encounter medications on file as of 05/02/2020.    Allergies:  Allergies  Allergen Reactions  . Stannous Fluoride Other (See Comments)    Other reaction(s): Unknown   . Polysporin [Bacitracin-Polymyxin B] Rash    Family History: Family History  Problem Relation Age of Onset  . Hepatitis Mother        idiopathic obstruction airway disease, venous insufficiency  . Osteoporosis Mother   . Hepatitis C Father   . Sarcoidosis Father   . Stroke Maternal Grandmother   . Melanoma Paternal Aunt   . Deep vein thrombosis Maternal  Uncle   . Asthma Son   . Allergy (severe) Son   . Asthma Son     Social History: Social History   Tobacco Use  . Smoking status: Never Smoker  . Smokeless tobacco: Never Used  Vaping Use  . Vaping Use: Never used  Substance Use Topics  . Alcohol use: Yes    Comment: ocassionally  . Drug use: Never   Social History   Social History Narrative   Right handed    Lives with family     Vital Signs:  BP 116/71   Pulse 68   Ht 5\' 5"  (1.651 m)   Wt 138 lb 6.4 oz (62.8 kg)   SpO2 100%   BMI 23.03 kg/m    Neurological Exam: MENTAL STATUS including orientation  to time, place, person, recent and remote memory, attention span and concentration, language, and fund of knowledge is normal.  Speech is not dysarthric.  CRANIAL NERVES: II:  No visual field defects.    III-IV-VI: Pupils equal round and reactive to light.  Normal conjugate, extra-ocular eye movements in all directions of gaze.  No nystagmus.  No ptosis.   V:  Normal facial sensation.    VII:  Normal facial symmetry and movements.   VIII:  Normal hearing and vestibular function.   IX-X:  Normal palatal movement.   XI:  Normal shoulder shrug and head rotation.   XII:  Normal tongue strength and range of motion, no deviation or fasciculation.  MOTOR:  No atrophy, fasciculations or abnormal movements.  No pronator drift.   Upper Extremity:  Right  Left  Deltoid  5/5   5/5   Biceps  5/5   5/5   Triceps  5/5   5/5   Infraspinatus 5/5  5/5  Medial pectoralis 5/5  5/5  Wrist extensors  5/5   5/5   Wrist flexors  5/5   5/5   Finger extensors  5/5   5/5   Finger flexors  5/5   5/5   Dorsal interossei  5/5   5/5   Abductor pollicis  5/5   5/5   Tone (Ashworth scale)  0  0   Lower Extremity:  Right  Left  Hip flexors  5/5   5/5   Hip extensors  5/5   5/5   Adductor 5/5  5/5  Abductor 5/5  5/5  Knee flexors  5/5   5/5   Knee extensors  5/5   5/5   Dorsiflexors  5/5   5/5   Plantarflexors  5/5   5/5   Toe extensors  5/5   5/5   Toe flexors  5/5   5/5   Tone (Ashworth scale)  0  0   MSRs:  Right        Left                  brachioradialis 2+  2+  biceps 2+  2+  triceps 2+  2+  patellar 2+  2+  ankle jerk 2+  2+  Hoffman no  no  plantar response down  down   SENSORY:  Normal and symmetric perception of light touch, pinprick, vibration, and proprioception.  Romberg's sign absent.   COORDINATION/GAIT: Normal finger-to- nose-finger and heel-to-shin.  Intact rapid alternating movements bilaterally.  Able to rise from a chair without using arms.  Gait narrow based and stable. Tandem  and stressed gait intact.    IMPRESSION: Constellation of vague symptoms (numbness, tingling,  subjective tremulousness) which are self-limiting in the setting of normal neurological exam.  Informed patient that her symptoms do not conform to as specific neurological condition and her exam is very reassuring. To be complete, I suggested MRI brain to exclude demyelinaing disease although my suspicion is very low and NCS/EMG.  She would like to proceed with NCS/EMG of the right arm and leg and contact the office, if she decides on getting MRI.   Further recommendations pending results.   Thank you for allowing me to participate in patient's care.  If I can answer any additional questions, I would be pleased to do so.    Sincerely,    Azaliah Carrero K. Allena Katz, DO

## 2020-05-02 NOTE — Patient Instructions (Addendum)
Please let me know if you would like to have MRI brain   Nerve testing of the right arm and leg.     ELECTROMYOGRAM AND NERVE CONDUCTION STUDIES (EMG/NCS) INSTRUCTIONS  How to Prepare The neurologist conducting the EMG will need to know if you have certain medical conditions. Tell the neurologist and other EMG lab personnel if you: . Have a pacemaker or any other electrical medical device . Take blood-thinning medications . Have hemophilia, a blood-clotting disorder that causes prolonged bleeding Bathing Take a shower or bath shortly before your exam in order to remove oils from your skin. Don't apply lotions or creams before the exam.  What to Expect You'll likely be asked to change into a hospital gown for the procedure and lie down on an examination table. The following explanations can help you understand what will happen during the exam.  . Electrodes. The neurologist or a technician places surface electrodes at various locations on your skin depending on where you're experiencing symptoms. Or the neurologist may insert needle electrodes at different sites depending on your symptoms.  . Sensations. The electrodes will at times transmit a tiny electrical current that you may feel as a twinge or spasm. The needle electrode may cause discomfort or pain that usually ends shortly after the needle is removed. If you are concerned about discomfort or pain, you may want to talk to the neurologist about taking a short break during the exam.  . Instructions. During the needle EMG, the neurologist will assess whether there is any spontaneous electrical activity when the muscle is at rest - activity that isn't present in healthy muscle tissue - and the degree of activity when you slightly contract the muscle.  He or she will give you instructions on resting and contracting a muscle at appropriate times. Depending on what muscles and nerves the neurologist is examining, he or she may ask you to change  positions during the exam.  After your EMG You may experience some temporary, minor bruising where the needle electrode was inserted into your muscle. This bruising should fade within several days. If it persists, contact your primary care doctor.

## 2020-05-24 ENCOUNTER — Ambulatory Visit: Payer: Self-pay | Admitting: Cardiovascular Disease

## 2020-06-14 ENCOUNTER — Other Ambulatory Visit: Payer: Self-pay

## 2020-06-14 ENCOUNTER — Telehealth: Payer: Self-pay

## 2020-06-14 ENCOUNTER — Ambulatory Visit (INDEPENDENT_AMBULATORY_CARE_PROVIDER_SITE_OTHER): Payer: Self-pay | Admitting: Neurology

## 2020-06-14 DIAGNOSIS — R202 Paresthesia of skin: Secondary | ICD-10-CM

## 2020-06-14 DIAGNOSIS — G35 Multiple sclerosis: Secondary | ICD-10-CM

## 2020-06-14 NOTE — Progress Notes (Signed)
    Follow-up Visit   Date: 06/14/20   Tiffany Hughes MRN: [id number] DOB: May 10, 1975   Interim History: Tiffany Hughes is a 45 y.o. right-handed Caucasian female with positive ANA returning to the clinic for follow-up of numbness/tingling and tremors.  The patient was accompanied to the clinic by self.  History of present illness: Starting in May 2021, she noticed tingling sensation in the hands and feet which she is laying down. It occurs about 5 times per week, which is present when she falls asleep and resolved by morning.  She denies weakness in the arms and legs.  She has generalized sense of fatigue.  She also had chest discomfort around the same time.  Cardiology evaluation including echo, CT chest, and cardiac monitoring was normal. In July, she began having trembling sensation over the face and chest.  She does not actually see any muscle twitches or abnormal movement, she senses it inside. She is seeing [facility] for evaluation of POTS.   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  She lives at home with husband and two sons.  She denies stress or anxiety.   UPDATE 06/14/2020:  She is here for electrodiagnostic testing.  Symptoms are unchanged.   Medications:  Current Outpatient Medications on File Prior to Visit  Medication Sig Dispense Refill  . Acetylcarnitine HCl (ACETYL L-CARNITINE) 500 MG CAPS daily.    . Acetylcysteine, Nutrient, 600 MG TABS Take by mouth daily.    Marland Kitchen CALCIUM PO Take 1,200 mg by mouth. Patient takes Critical calcium citrate    . Cholecalciferol (VITAMIN D3) 125 MCG (5000 UT) CAPS Take by mouth.    . Flaxseed, Linseed, (FLAXSEED OIL PO) Take 3,000 mg by mouth.     . magnesium (MAGTAB) 84 MG ( ) TBCR SR tablet Take 84 mg by mouth.    . Omega-3 Fatty Acids (FISH OIL PO) Take 3,000 mg by mouth.     . Prenatal Vit-Fe Fumarate-FA (PRENATAL MULTIVITAMIN) TABS tablet Take 1 tablet by mouth daily at 12 noon.    . terbinafine  (LAMISIL) 1 % cream Apply 1 application topically 2 (two) times daily. Patient states that it is a liquid that she drips on her toenail     No current facility-administered medications on file prior to visit.    Allergies:  Allergies  Allergen Reactions  . Stannous Fluoride Other (See Comments)    Other reaction(s): Unknown   . Polysporin [Bacitracin-Polymyxin B] Rash    Exam deferred, see note on 05/02/2020  Data: NCS/EMG of the right arm and leg 06/14/2020:  Normal  IMPRESSION/PLAN: Generalized paresthesias of the arms and legs.  Normal neurological exam is reassuring.  NCS/EMG results were discussed which is normal, no evidence of neuropathy, entrapment neuropathy, or radiculopathy.  I will check MRI brain wo contrast to evaluate for demyelinating disease, although my overall suspicion is very low.    Thank you for allowing me to participate in patient's care.  If I can answer any additional questions, I would be pleased to do so.    Sincerely,    Emmalyn Hinson K. Allena Katz, DO

## 2020-06-14 NOTE — Procedures (Signed)
[facility] Neurology  [address] [place], [address]  [place], [place] [address] Tel: [phone] Fax:  [phone] Test Date:  06/14/2020  Patient: Tiffany Hughes DOB: 1975-12-31 Physician: Nita Sickle, DO  Sex: Female Height: 5\' 5"  Ref Phys: , DO  ID#: Nita Sickle   Technician:    Patient Complaints: This is a 45 year old female referred for evaluation of generalized numbness and tingling.  NCV & EMG Findings: Extensive electrodiagnostic testing of the right upper and lower extremity shows:  1. All sensory responses including the right median, ulnar, mixed palmar, sural, and superficial peroneal nerves are within normal limits. 2. All motor responses including the right median, ulnar, peroneal, and tibial nerves are within normal limits. 3. Right tibial H reflex study is within normal limits. 4. There is no evidence of active or chronic motor axonal loss changes affecting any of the tested muscles.    Impression: This is a normal study of the right upper and lower extremities.  In particular, there is no evidence of carpal tunnel syndrome, cervical/lumbosacral radiculopathy, or a sensorimotor polyneuropathy.   ___________________________ 59, DO    Nerve Conduction Studies Anti Sensory Summary Table   Stim Site NR Peak (ms) Norm Peak (ms) P-T Amp (V) Norm P-T Amp  Right Median Anti Sensory (2nd Digit)  32C  Wrist    2.8 <3.4 57.3 >20  Right Sup Peroneal Anti Sensory (Ant Lat Mall)  32C  12 cm    2.7 <4.5 11.5 >5  Right Sural Anti Sensory (Lat Mall)  32C  Calf    3.3 <4.5 22.6 >5  Right Ulnar Anti Sensory (5th Digit)  32C  Wrist    2.6 <3.1 51.6 >12   Motor Summary Table   Stim Site NR Onset (ms) Norm Onset (ms) O-P Amp (mV) Norm O-P Amp Site1 Site2 Delta-0 (ms) Dist (cm) Vel (m/s) Norm Vel (m/s)  Right Median Motor (Abd Poll Brev)  32C  Wrist    2.3 <3.9 11.1 >6 Elbow Wrist 4.3 27.0 63 >50  Elbow    6.6  10.7         Right Peroneal Motor (Ext  Dig Brev)  32C  Ankle    3.0 <5.5 7.7 >3 B Fib Ankle 6.5 34.0 52 >40  B Fib    9.5  7.2  Poplt B Fib 1.4 8.0 57 >40  Poplt    10.9  7.1         Right Tibial Motor (Abd Hall Brev)  32C  Ankle    3.4 <6.0 13.0 >8 Knee Ankle 7.2 38.0 53 >40  Knee    10.6  10.2         Right Ulnar Motor (Abd Dig Minimi)  32C  Wrist    1.8 <3.1 10.5 >7 B Elbow Wrist 3.4 23.0 68 >50  B Elbow    5.2  10.3  A Elbow B Elbow 1.5 10.0 67 >50  A Elbow    6.7  9.4          Comparison Summary Table   Stim Site NR Peak (ms) Norm Peak (ms) P-T Amp (V) Site1 Site2 Delta-P (ms) Norm Delta (ms)  Right Median/Ulnar Palm Comparison (Wrist - 8cm)  32C  Median Palm    1.6 <2.2 47.8 Median Palm Ulnar Palm 0.1   Ulnar Palm    1.5 <2.2 23.6       H Reflex Studies   NR H-Lat (ms) Lat Norm (ms) L-R H-Lat (ms)  Right  Tibial (Gastroc)  32C     31.29 <35    EMG   Side Muscle Ins Act Fibs Psw Fasc Number Recrt Dur Dur. Amp Amp. Poly Poly. Comment  Right AntTibialis Nml Nml Nml Nml Nml Nml Nml Nml Nml Nml Nml Nml N/A  Right Gastroc Nml Nml Nml Nml Nml Nml Nml Nml Nml Nml Nml Nml N/A  Right Flex Dig Long Nml Nml Nml Nml Nml Nml Nml Nml Nml Nml Nml Nml N/A  Right RectFemoris Nml Nml Nml Nml Nml Nml Nml Nml Nml Nml Nml Nml N/A  Right GluteusMed Nml Nml Nml Nml Nml Nml Nml Nml Nml Nml Nml Nml N/A  Right 1stDorInt Nml Nml Nml Nml Nml Nml Nml Nml Nml Nml Nml Nml N/A  Right PronatorTeres Nml Nml Nml Nml Nml Nml Nml Nml Nml Nml Nml Nml N/A  Right Biceps Nml Nml Nml Nml Nml Nml Nml Nml Nml Nml Nml Nml N/A  Right Triceps Nml Nml Nml Nml Nml Nml Nml Nml Nml Nml Nml Nml N/A  Right Deltoid Nml Nml Nml Nml Nml Nml Nml Nml Nml Nml Nml Nml N/A      Waveforms:

## 2020-06-14 NOTE — Telephone Encounter (Signed)
-----   Message from Glendale Chard, DO sent at 06/14/2020 11:17 AM EDT ----- Please order MRI brain wo contrast. Write in comments:  evaluate for MS. Thanks.

## 2020-06-22 ENCOUNTER — Telehealth: Payer: Self-pay | Admitting: Pharmacy Technician

## 2020-06-27 ENCOUNTER — Ambulatory Visit
Admission: RE | Admit: 2020-06-27 | Discharge: 2020-06-27 | Disposition: A | Discharge status: Home / Self Care | Payer: No Typology Code available for payment source | Source: Ambulatory Visit | Attending: Neurology | Admitting: Neurology

## 2020-06-27 DIAGNOSIS — G35 Multiple sclerosis: Secondary | ICD-10-CM

## 2020-06-28 ENCOUNTER — Telehealth: Payer: Self-pay

## 2020-06-28 NOTE — Telephone Encounter (Signed)
Called patient and informed her that Dr. Allena Katz is out of office and that Dr. Arbutus Leas looked over her MRI of brain. Informed patient per Dr. Arbutus Leas that she does not think MRI is consistent with demyelinating disease such as MS. Informed patient that I would send result back to Dr Allena Katz to see if there is anything further from Dr. Allena Katz.  Patient verbalized understanding.

## 2020-06-28 NOTE — Telephone Encounter (Signed)
-----   Message from Octaviano Batty Tat, DO sent at 06/27/2020  3:52 PM EDT ----- Let pt know that Dr. Allena Katz is out of the office.  I personally looked at her MRI brain and I don't think that it looks consistent with demyelinating disease such as MS.  We will see if Dr. Allena Katz has anything further when she returns.  Wanted to address so patient not worried.

## 2020-06-29 NOTE — Telephone Encounter (Signed)
Called patient and informed her of Dr. Eliane Decree recommendations. Patient verbalized understanding and had no further questions or concerns.

## 2020-06-29 NOTE — Telephone Encounter (Signed)
Please inform patient that I have reviewed her MRI and I agree that findings are not consistent with demyelinating changes.  With her imaging and nerve testing being normal, it is very reassuring that she does not have primary nerve-related condition.  I recommend that we continue to monitor her symptoms, if they get worse, come back and see me.  Otherwise, recommend follow-up in 6 months.   Thanks.

## 2020-08-01 ENCOUNTER — Encounter: Payer: Self-pay | Admitting: *Deleted

## 2020-09-06 ENCOUNTER — Encounter: Payer: Self-pay | Admitting: *Deleted

## 2020-10-10 ENCOUNTER — Ambulatory Visit: Payer: Self-pay | Admitting: Rheumatology

## 2021-10-12 IMAGING — DX DG CHEST 2V
2 series · 2 of 2 positions shown · non-contrast
Comparison: None.

CLINICAL DATA: Chest pain and jaw tightness for 1 day.

EXAM:
CHEST - 2 VIEW

[chest pa]
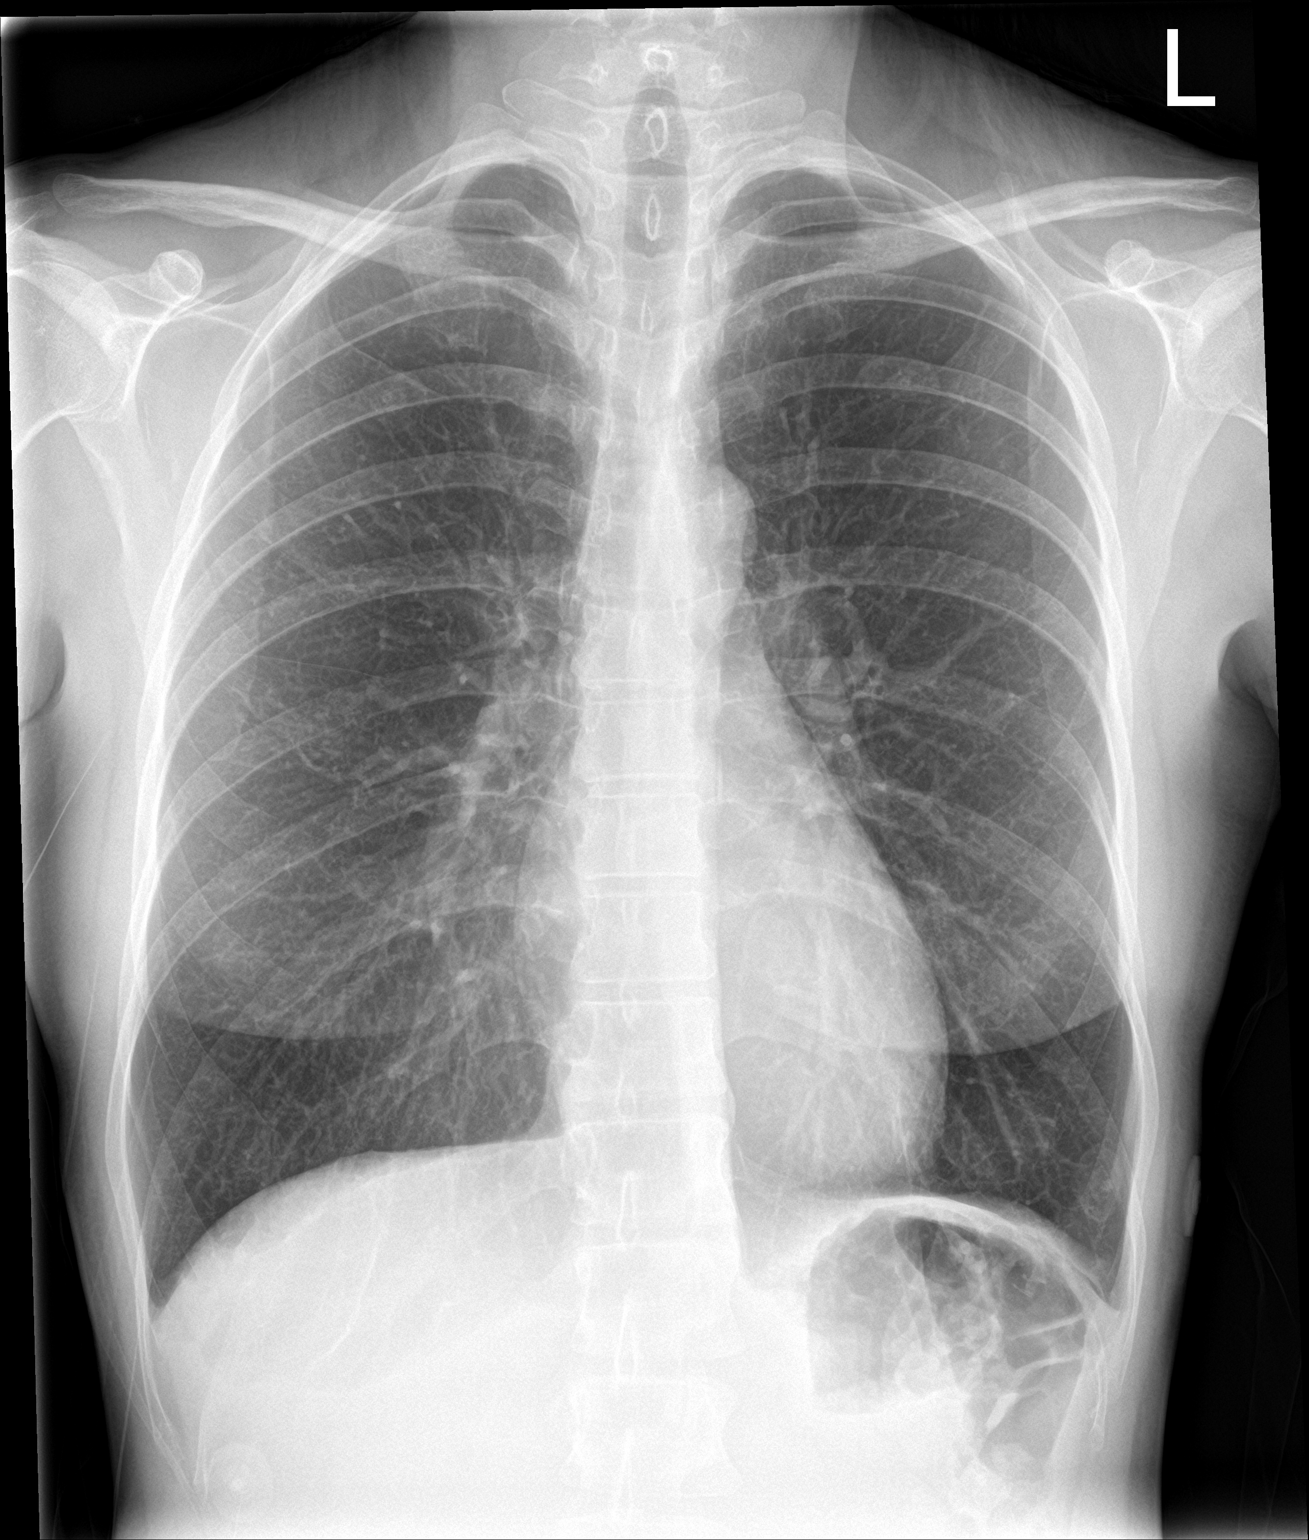

[chest lat]
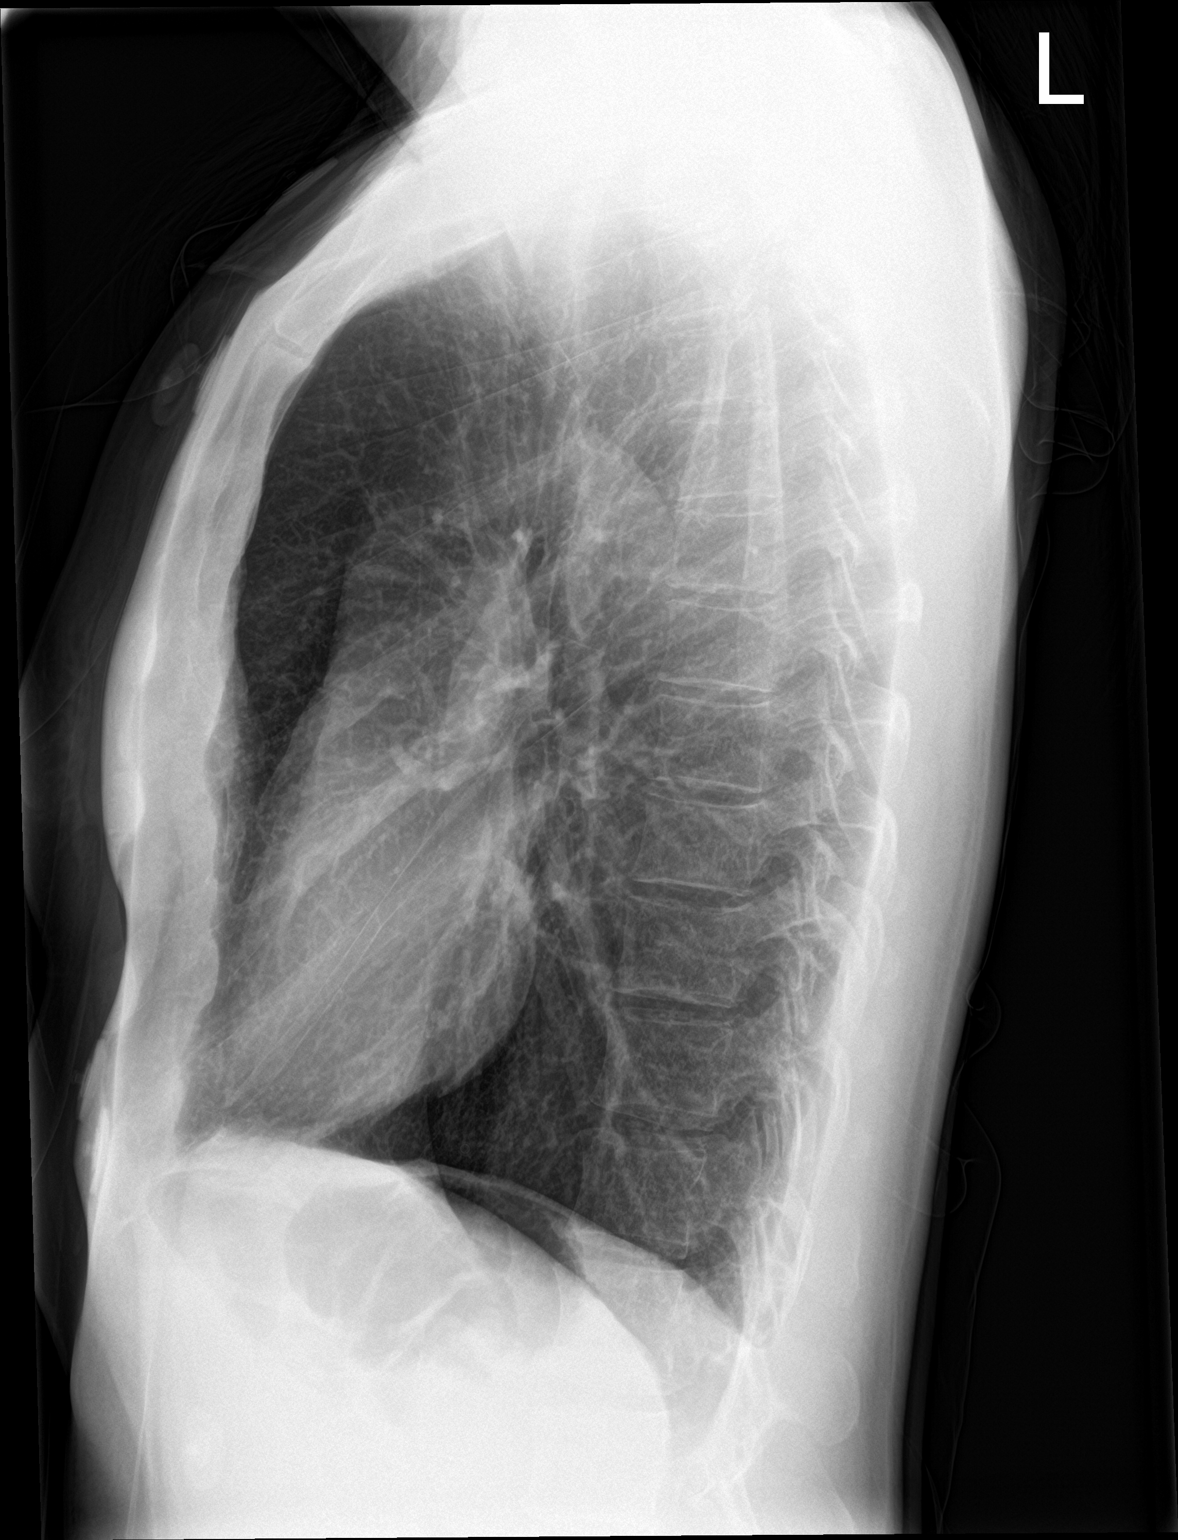

[2 of 2 positions shown; findings below may reference images not displayed]

FINDINGS: Lungs clear. Heart size normal. No pneumothorax or pleural fluid. No
acute or focal bony abnormality.
IMPRESSION: Negative chest.

## 2021-11-23 ENCOUNTER — Ambulatory Visit (INDEPENDENT_AMBULATORY_CARE_PROVIDER_SITE_OTHER): Payer: Self-pay | Admitting: Podiatry

## 2021-11-23 ENCOUNTER — Encounter: Payer: Self-pay | Admitting: Podiatry

## 2021-11-23 DIAGNOSIS — Q828 Other specified congenital malformations of skin: Secondary | ICD-10-CM

## 2021-11-23 DIAGNOSIS — L600 Ingrowing nail: Secondary | ICD-10-CM

## 2021-11-23 NOTE — Patient Instructions (Signed)

## 2021-11-24 NOTE — Progress Notes (Signed)
Subjective:   Patient ID: Tiffany Hughes, female   DOB: 46 y.o.   MRN: [id number]   HPI Patient presents with a ingrown toenail deformity left over right big toe and has had nail structural issues over the years and has some lesions underneath the foot that can be bothersome.  This has been going on for a long time does not smoke likes to be active   Review of Systems  All other systems reviewed and are negative.       Objective:  Physical Exam Vitals and nursing note reviewed.  Constitutional:      Appearance: She is well-developed.  Pulmonary:     Effort: Pulmonary effort is normal.  Musculoskeletal:        General: Normal range of motion.  Skin:    General: Skin is warm.  Neurological:     Mental Status: She is alert.     Neurovascular status intact muscle strength adequate range of motion adequate incurvated hallux nail bed left and right with the left being worse lateral border with pain upon pressure.  No erythema no edema or drainage noted with discomfort.  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  Patient signed consent form had a left hallux infiltrated 60 mg Xylocaine Marcaine mixture sterile prep done using sterile instrumentation remove the lateral border exposed matrix applied phenol 3 applications 30 seconds followed by alcohol lavage sterile dressing gave instructions on soaks and to leave dressing on 24 hours but take it off or earlier if throbbing were to occur.  Encouraged her to call questions concerns and may need the right one done in future

## 2022-09-19 IMAGING — MR MR HEAD W/O CM
11 series · 48 of 48 positions shown · non-contrast
Comparison: None.

CLINICAL DATA: Evaluate for MS. Tingling in hands/feet.

EXAM:
MRI HEAD WITHOUT CONTRAST
TECHNIQUE: Multiplanar, multiecho pulse sequences of the brain and surrounding
structures were obtained without intravenous contrast.

[Series 2: T1 · sagittal · 5.0mm · 0.45mm/px · 3 of 22 slices shown]
[im 1/22]
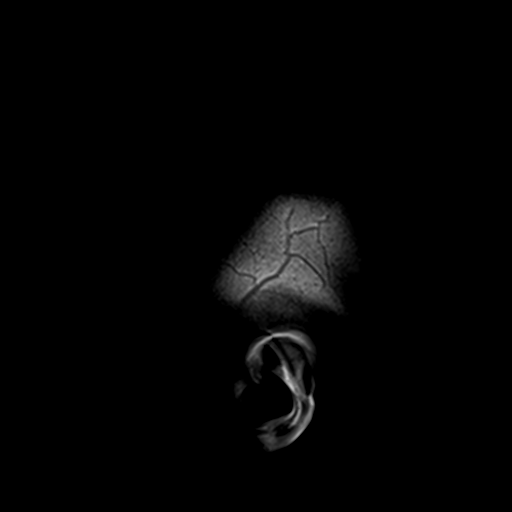
[im 11/22]
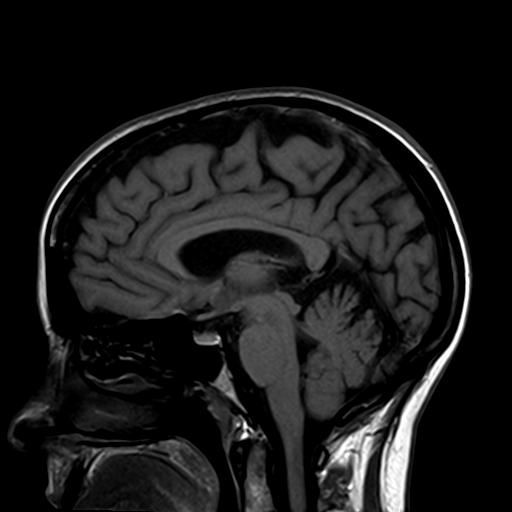
[im 22/22]
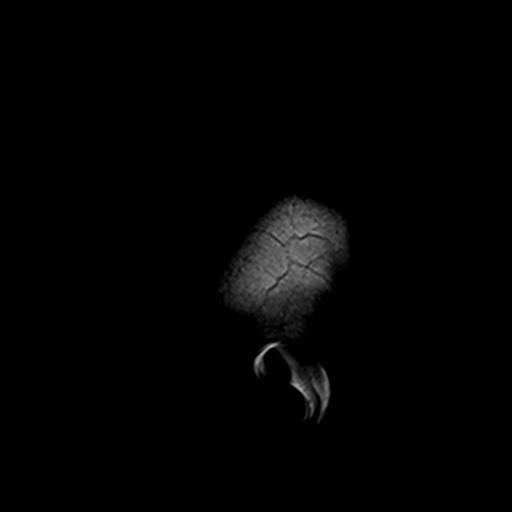

[Series 3: DWI · axial · 3.0mm · 1.80mm/px · z∈[-61,+95]mm · 9 of 106 slices shown (1 of 4)]
[im 1/106]
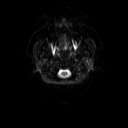
[im 14/106]
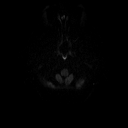
[im 27/106]
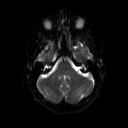
[im 40/106]
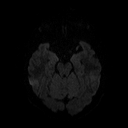
[im 53/106]
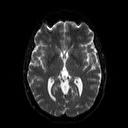
[im 66/106]
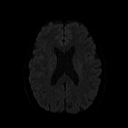
[im 79/106]
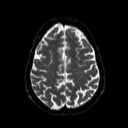
[im 92/106]
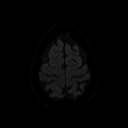
[im 106/106]
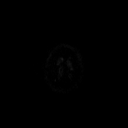

[Series 4: DWI · axial · 3.0mm · 1.80mm/px · z∈[-61,+95]mm · 4 of 52 slices shown (2 of 4)]
[im 1/52]
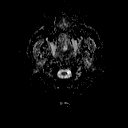
[im 18/52]
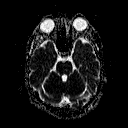
[im 35/52]
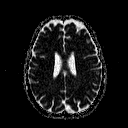
[im 52/52]
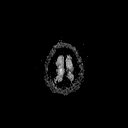

[Series 5: DWI · coronal · 5.0mm · 1.80mm/px · 6 of 76 slices shown (3 of 4)]
[im 1/76]
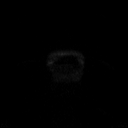
[im 16/76]
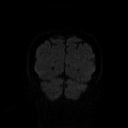
[im 31/76]
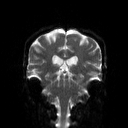
[im 46/76]
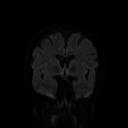
[im 61/76]
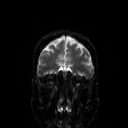
[im 76/76]
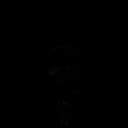

[Series 6: DWI · coronal · 5.0mm · 1.80mm/px · 3 of 38 slices shown (4 of 4)]
[im 1/38]
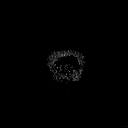
[im 19/38]
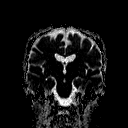
[im 38/38]
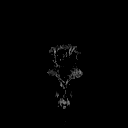

[Series 7: T2 · axial · 5.0mm · 0.51mm/px · z∈[-75,+85]mm · 2 of 24 slices shown (1 of 2)]
[im 1/24]
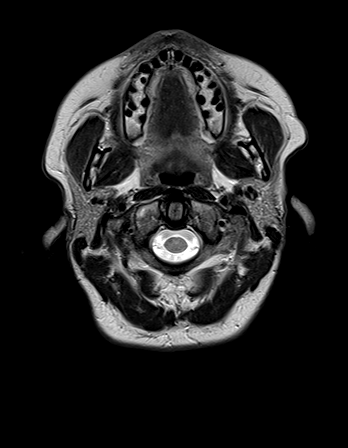
[im 24/24]
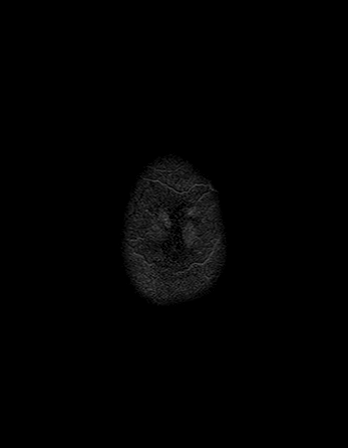

[Series 8: FLAIR · sagittal · 5.0mm · 0.45mm/px · 2 of 25 slices shown (1 of 2)]
[im 1/25]
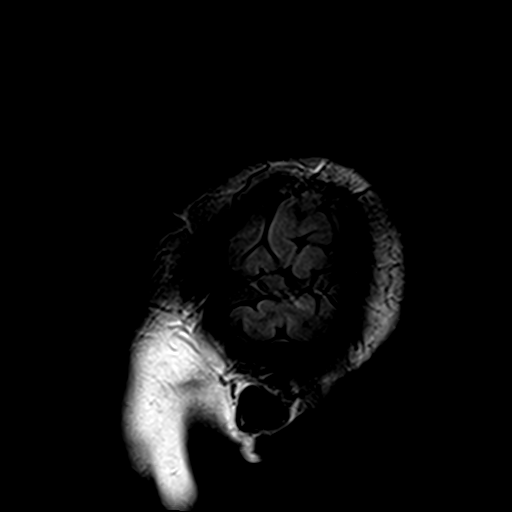
[im 25/25]
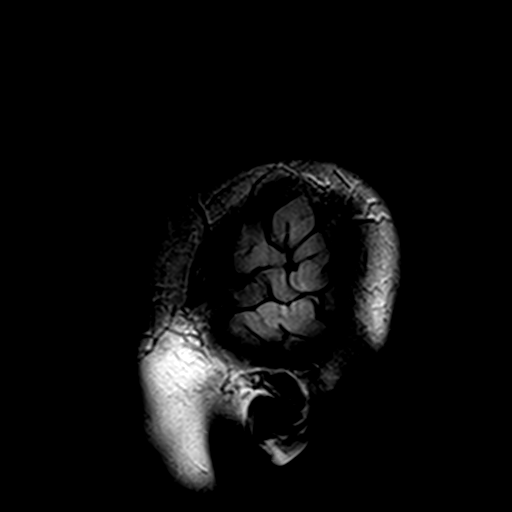

[Series 9: FLAIR · axial · 3.0mm · 0.45mm/px · z∈[-61,+73]mm · 2 of 30 slices shown (2 of 2)]
[im 1/30]
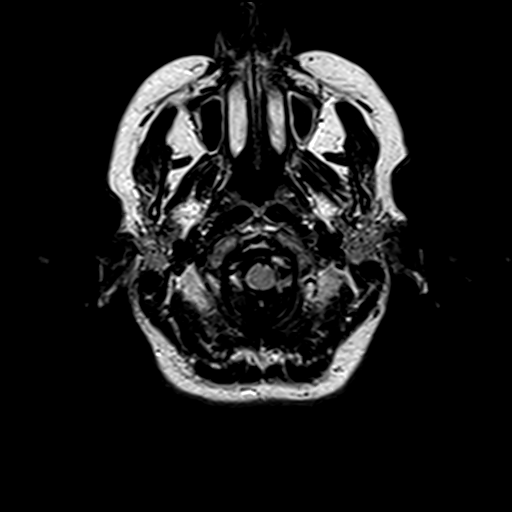
[im 30/30]
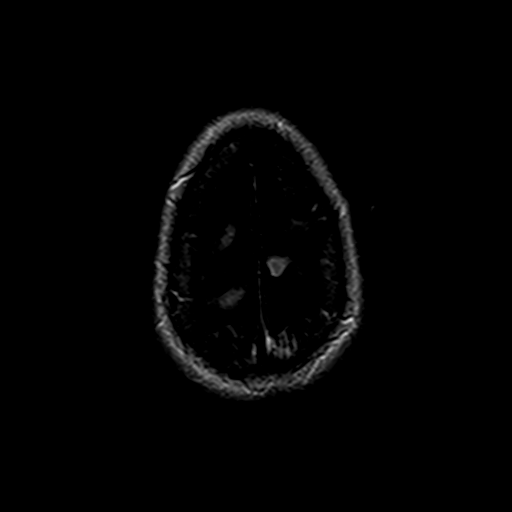

[Series 11: swi_images · axial · 4.0mm · 0.90mm/px · z∈[-64,+75]mm · 3 of 36 slices shown]
[im 1/36]
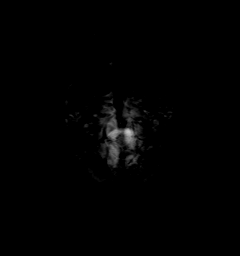
[im 18/36]
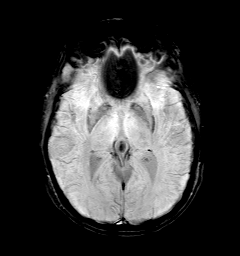
[im 36/36]
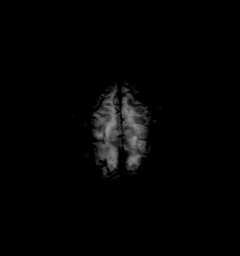

[Series 12: t1_mpr_tra · axial · 1.0mm · 0.71mm/px · z∈[-66,+76]mm · 12 of 144 slices shown]
[im 1/144]
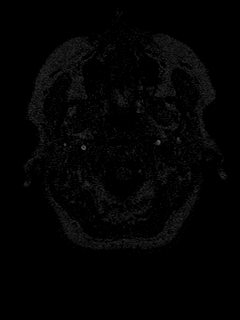
[im 14/144]
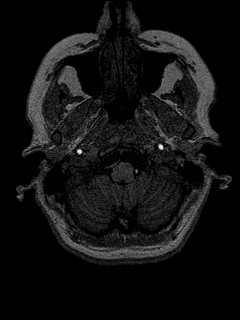
[im 27/144]
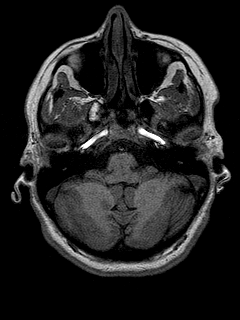
[im 40/144]
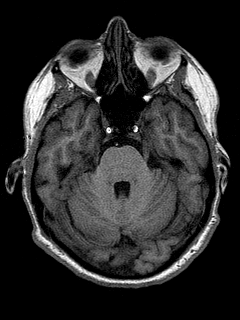
[im 53/144]
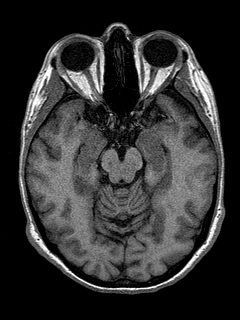
[im 66/144]
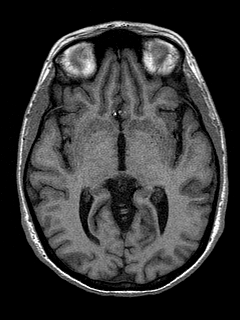
[im 79/144]
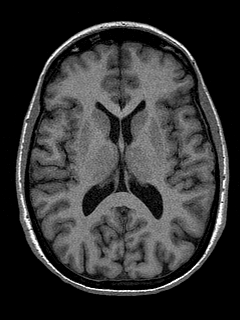
[im 92/144]
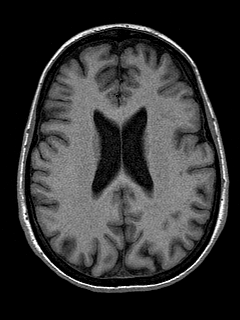
[im 105/144]
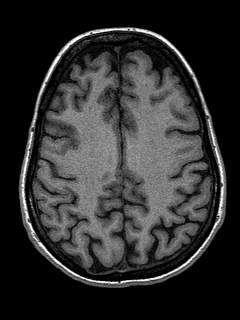
[im 118/144]
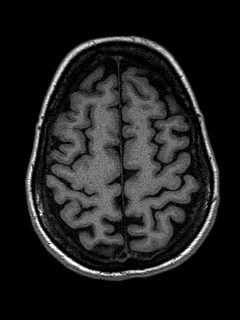
[im 131/144]
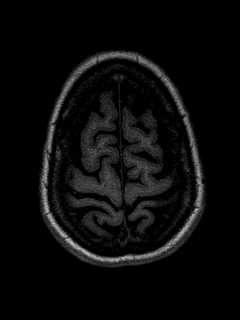
[im 144/144]
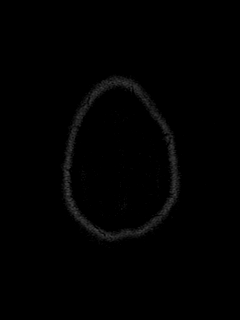

[Series 14: T2 · coronal · 5.0mm · 0.45mm/px · 2 of 29 slices shown (2 of 2)]
[im 1/29]
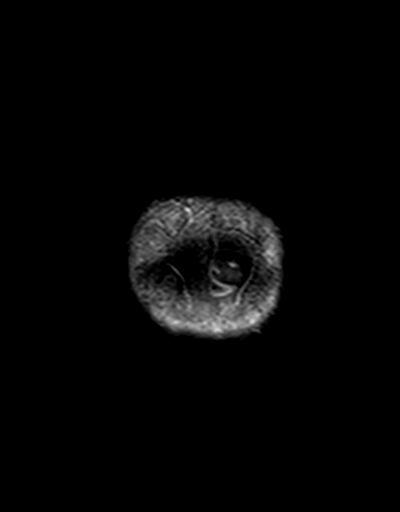
[im 29/29]
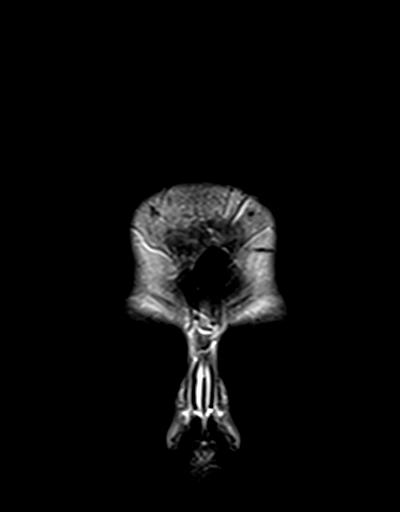

[48 of 48 positions shown; findings below may reference images not displayed]

FINDINGS: Brain: No acute infarction, hemorrhage, hydrocephalus, extra-axial
collection or mass lesion. Mildly age-advanced number of
supratentorial T2/FLAIR hyperintensities within the white matter.

Vascular: Major arterial flow voids are maintained at the skull
base.

Skull and upper cervical spine: Normal marrow signal.

Sinuses/Orbits: Mild mucosal thickening of scattered ethmoid air
cells in the inferior maxillary sinuses. No air-fluid levels.
Unremarkable orbits.

Other: No mastoid effusions.
IMPRESSION: 1. Mildly age-advanced number of supratentorial T2/FLAIR
hyperintensities within the white matter. This finding is
nonspecific but can be seen in the setting of chronic microvascular
ischemia, a demyelinating process such as multiple sclerosis,
vasculitis, chronic migraines, or as the sequelae of a prior
infectious or inflammatory process.
2. Otherwise, no acute abnormality.

## 2023-12-13 ENCOUNTER — Ambulatory Visit (HOSPITAL_BASED_OUTPATIENT_CLINIC_OR_DEPARTMENT_OTHER)
Admission: RE | Admit: 2023-12-13 | Discharge: 2023-12-13 | Disposition: A | Discharge status: Home / Self Care | Source: Ambulatory Visit

## 2023-12-13 ENCOUNTER — Encounter (HOSPITAL_BASED_OUTPATIENT_CLINIC_OR_DEPARTMENT_OTHER): Payer: Self-pay | Admitting: Radiology

## 2023-12-13 ENCOUNTER — Other Ambulatory Visit (HOSPITAL_BASED_OUTPATIENT_CLINIC_OR_DEPARTMENT_OTHER): Payer: Self-pay | Admitting: Specialist

## 2023-12-13 DIAGNOSIS — Z1231 Encounter for screening mammogram for malignant neoplasm of breast: Secondary | ICD-10-CM | POA: Diagnosis present

## 2023-12-31 ENCOUNTER — Other Ambulatory Visit (HOSPITAL_BASED_OUTPATIENT_CLINIC_OR_DEPARTMENT_OTHER): Payer: Self-pay | Admitting: Specialist

## 2023-12-31 ENCOUNTER — Inpatient Hospital Stay
Admission: RE | Admit: 2023-12-31 | Discharge: 2023-12-31 | Disposition: A | Payer: Self-pay | Source: Ambulatory Visit | Attending: Specialist | Admitting: Specialist

## 2023-12-31 DIAGNOSIS — Z9289 Personal history of other medical treatment: Secondary | ICD-10-CM
